# Patient Record
Sex: Female | Born: 1950 | Race: White | Hispanic: No | Marital: Single | Smoking: Former smoker
Health system: Southern US, Community
[De-identification: ages and names within clinical notes are randomized; demographics above are authoritative.]

## PROBLEM LIST (undated history)

## (undated) DIAGNOSIS — J189 Pneumonia, unspecified organism: Secondary | ICD-10-CM

## (undated) DIAGNOSIS — Z6841 Body Mass Index (BMI) 40.0 and over, adult: Secondary | ICD-10-CM

## (undated) DIAGNOSIS — I4891 Unspecified atrial fibrillation: Secondary | ICD-10-CM

## (undated) DIAGNOSIS — I5021 Acute systolic (congestive) heart failure: Secondary | ICD-10-CM

## (undated) DIAGNOSIS — R601 Generalized edema: Secondary | ICD-10-CM

## (undated) DIAGNOSIS — I10 Essential (primary) hypertension: Secondary | ICD-10-CM

## (undated) DIAGNOSIS — R600 Localized edema: Secondary | ICD-10-CM

## (undated) DIAGNOSIS — R0600 Dyspnea, unspecified: Secondary | ICD-10-CM

## (undated) HISTORY — DX: Morbid (severe) obesity due to excess calories: E66.01

## (undated) HISTORY — DX: Dyspnea, unspecified: R06.00

## (undated) HISTORY — DX: Body Mass Index (BMI) 40.0 and over, adult: Z684

## (undated) HISTORY — DX: Acute systolic (congestive) heart failure: I50.21

## (undated) HISTORY — DX: Localized edema: R60.0

## (undated) HISTORY — DX: Unspecified atrial fibrillation: I48.91

## (undated) HISTORY — DX: Generalized edema: R60.1

## (undated) HISTORY — DX: Essential (primary) hypertension: I10

## (undated) HISTORY — DX: Pneumonia, unspecified organism: J18.9

---

## 2016-01-29 ENCOUNTER — Inpatient Hospital Stay (HOSPITAL_COMMUNITY)
Admission: EM | Admit: 2016-01-29 | Discharge: 2016-02-04 | DRG: 308 | Disposition: A | Discharge status: Home / Self Care | Payer: BLUE CROSS/BLUE SHIELD | Attending: Internal Medicine | Admitting: Internal Medicine

## 2016-01-29 ENCOUNTER — Encounter (HOSPITAL_COMMUNITY): Payer: Self-pay

## 2016-01-29 ENCOUNTER — Emergency Department (HOSPITAL_COMMUNITY): Payer: BLUE CROSS/BLUE SHIELD

## 2016-01-29 DIAGNOSIS — I4891 Unspecified atrial fibrillation: Secondary | ICD-10-CM | POA: Diagnosis not present

## 2016-01-29 DIAGNOSIS — R600 Localized edema: Secondary | ICD-10-CM | POA: Diagnosis present

## 2016-01-29 DIAGNOSIS — Z6841 Body Mass Index (BMI) 40.0 and over, adult: Secondary | ICD-10-CM

## 2016-01-29 DIAGNOSIS — F419 Anxiety disorder, unspecified: Secondary | ICD-10-CM | POA: Diagnosis not present

## 2016-01-29 DIAGNOSIS — R0602 Shortness of breath: Secondary | ICD-10-CM

## 2016-01-29 DIAGNOSIS — J189 Pneumonia, unspecified organism: Secondary | ICD-10-CM | POA: Diagnosis present

## 2016-01-29 DIAGNOSIS — Z87891 Personal history of nicotine dependence: Secondary | ICD-10-CM

## 2016-01-29 DIAGNOSIS — I5021 Acute systolic (congestive) heart failure: Secondary | ICD-10-CM | POA: Diagnosis present

## 2016-01-29 DIAGNOSIS — I5031 Acute diastolic (congestive) heart failure: Secondary | ICD-10-CM | POA: Diagnosis not present

## 2016-01-29 DIAGNOSIS — I5020 Unspecified systolic (congestive) heart failure: Secondary | ICD-10-CM | POA: Diagnosis present

## 2016-01-29 DIAGNOSIS — R0600 Dyspnea, unspecified: Secondary | ICD-10-CM

## 2016-01-29 DIAGNOSIS — I480 Paroxysmal atrial fibrillation: Secondary | ICD-10-CM

## 2016-01-29 DIAGNOSIS — E669 Obesity, unspecified: Secondary | ICD-10-CM | POA: Diagnosis present

## 2016-01-29 DIAGNOSIS — I429 Cardiomyopathy, unspecified: Secondary | ICD-10-CM | POA: Diagnosis present

## 2016-01-29 DIAGNOSIS — R601 Generalized edema: Secondary | ICD-10-CM | POA: Diagnosis present

## 2016-01-29 DIAGNOSIS — I248 Other forms of acute ischemic heart disease: Secondary | ICD-10-CM | POA: Diagnosis not present

## 2016-01-29 HISTORY — DX: Pneumonia, unspecified organism: J18.9

## 2016-01-29 HISTORY — DX: Unspecified atrial fibrillation: I48.91

## 2016-01-29 LAB — BRAIN NATRIURETIC PEPTIDE: B Natriuretic Peptide: 513.8 pg/mL — ABNORMAL HIGH (ref 0.0–100.0)

## 2016-01-29 LAB — PROTIME-INR
INR: 1.42
PROTHROMBIN TIME: 17.5 s — AB (ref 11.4–15.2)

## 2016-01-29 LAB — CBC
HCT: 48.1 % — ABNORMAL HIGH (ref 36.0–46.0)
Hemoglobin: 16.3 g/dL — ABNORMAL HIGH (ref 12.0–15.0)
MCH: 30.2 pg (ref 26.0–34.0)
MCHC: 33.9 g/dL (ref 30.0–36.0)
MCV: 89.1 fL (ref 78.0–100.0)
PLATELETS: 243 10*3/uL (ref 150–400)
RBC: 5.4 MIL/uL — ABNORMAL HIGH (ref 3.87–5.11)
RDW: 14.8 % (ref 11.5–15.5)
WBC: 11.8 10*3/uL — AB (ref 4.0–10.5)

## 2016-01-29 LAB — BASIC METABOLIC PANEL
Anion gap: 14 (ref 5–15)
BUN: 12 mg/dL (ref 6–20)
CALCIUM: 9.2 mg/dL (ref 8.9–10.3)
CHLORIDE: 102 mmol/L (ref 101–111)
CO2: 21 mmol/L — ABNORMAL LOW (ref 22–32)
CREATININE: 0.89 mg/dL (ref 0.44–1.00)
GFR calc non Af Amer: >60 mL/min (ref >60)
Glucose, Bld: 113 mg/dL — ABNORMAL HIGH (ref 65–99)
Potassium: 3.9 mmol/L (ref 3.5–5.1)
SODIUM: 137 mmol/L (ref 135–145)

## 2016-01-29 LAB — I-STAT TROPONIN, ED
TROPONIN I, POC: 0.03 ng/mL (ref 0.00–0.08)
Troponin i, poc: 0.05 ng/mL (ref 0.00–0.08)

## 2016-01-29 LAB — HEPARIN LEVEL (UNFRACTIONATED): HEPARIN UNFRACTIONATED: 0.35 [IU]/mL (ref 0.30–0.70)

## 2016-01-29 LAB — HEPATIC FUNCTION PANEL
ALT: 27 U/L (ref 14–54)
AST: 35 U/L (ref 15–41)
Albumin: 3.6 g/dL (ref 3.5–5.0)
Alkaline Phosphatase: 62 U/L (ref 38–126)
BILIRUBIN DIRECT: 0.6 mg/dL — AB (ref 0.1–0.5)
BILIRUBIN INDIRECT: 1.2 mg/dL — AB (ref 0.3–0.9)
BILIRUBIN TOTAL: 1.8 mg/dL — AB (ref 0.3–1.2)
Total Protein: 7.3 g/dL (ref 6.5–8.1)

## 2016-01-29 LAB — TSH
TSH: 3.477 u[IU]/mL (ref 0.350–4.500)
TSH: 2.363 u[IU]/mL (ref 0.350–4.500)

## 2016-01-29 LAB — TROPONIN I: TROPONIN I: 0.03 ng/mL — AB (ref <0.03)

## 2016-01-29 MED ORDER — DILTIAZEM HCL 100 MG IV SOLR
5.0000 mg/h | INTRAVENOUS | Status: DC
  Administered 2016-01-29 – 2016-01-30 (×2): 15 mg/h via INTRAVENOUS
  Filled 2016-01-29: qty 100

## 2016-01-29 MED ORDER — AMIODARONE LOAD VIA INFUSION
150.0000 mg | Freq: Once | INTRAVENOUS | Status: AC
  Administered 2016-01-30: 150 mg via INTRAVENOUS
  Filled 2016-01-29: qty 83.34

## 2016-01-29 MED ORDER — AMIODARONE HCL IN DEXTROSE 360-4.14 MG/200ML-% IV SOLN
60.0000 mg/h | INTRAVENOUS | Status: AC
  Administered 2016-01-30 (×2): 60 mg/h via INTRAVENOUS
  Filled 2016-01-29 (×2): qty 200

## 2016-01-29 MED ORDER — HEPARIN (PORCINE) IN NACL 100-0.45 UNIT/ML-% IJ SOLN
1350.0000 [IU]/h | INTRAMUSCULAR | Status: DC
  Administered 2016-01-29 (×2): 1350 [IU]/h via INTRAVENOUS
  Filled 2016-01-29 (×2): qty 250

## 2016-01-29 MED ORDER — DILTIAZEM LOAD VIA INFUSION
10.0000 mg | Freq: Once | INTRAVENOUS | Status: AC
  Administered 2016-01-29: 10 mg via INTRAVENOUS
  Filled 2016-01-29: qty 10

## 2016-01-29 MED ORDER — ASPIRIN EC 81 MG PO TBEC
81.0000 mg | DELAYED_RELEASE_TABLET | Freq: Every day | ORAL | Status: DC
  Administered 2016-01-29 – 2016-01-30 (×2): 81 mg via ORAL
  Filled 2016-01-29 (×2): qty 1

## 2016-01-29 MED ORDER — AMIODARONE HCL IN DEXTROSE 360-4.14 MG/200ML-% IV SOLN
30.0000 mg/h | INTRAVENOUS | Status: DC
Start: 2016-01-30 — End: 2016-02-02
  Administered 2016-01-30 – 2016-02-02 (×5): 30 mg/h via INTRAVENOUS
  Filled 2016-01-29 (×6): qty 200

## 2016-01-29 MED ORDER — ACETAMINOPHEN 325 MG PO TABS: 650.0000 mg | ORAL_TABLET | ORAL | Status: DC | PRN

## 2016-01-29 MED ORDER — FUROSEMIDE 10 MG/ML IJ SOLN
40.0000 mg | Freq: Two times a day (BID) | INTRAMUSCULAR | Status: DC
  Administered 2016-01-30 (×3): 40 mg via INTRAVENOUS
  Filled 2016-01-29 (×4): qty 4

## 2016-01-29 MED ORDER — AZITHROMYCIN 250 MG PO TABS
500.0000 mg | ORAL_TABLET | ORAL | Status: DC
  Administered 2016-01-30 – 2016-02-03 (×5): 500 mg via ORAL
  Filled 2016-01-29 (×5): qty 2

## 2016-01-29 MED ORDER — DEXTROSE 5 % IV SOLN
500.0000 mg | Freq: Once | INTRAVENOUS | Status: AC
  Administered 2016-01-29: 500 mg via INTRAVENOUS
  Filled 2016-01-29: qty 500

## 2016-01-29 MED ORDER — DEXTROSE 5 % IV SOLN
1.0000 g | Freq: Once | INTRAVENOUS | Status: AC
  Administered 2016-01-29: 1 g via INTRAVENOUS
  Filled 2016-01-29: qty 10

## 2016-01-29 MED ORDER — DEXTROSE 5 % IV SOLN
1.0000 g | INTRAVENOUS | Status: DC
  Administered 2016-01-30 – 2016-02-03 (×5): 1 g via INTRAVENOUS
  Filled 2016-01-29 (×6): qty 10

## 2016-01-29 MED ORDER — DILTIAZEM HCL 100 MG IV SOLR
5.0000 mg/h | INTRAVENOUS | Status: DC
  Administered 2016-01-29: 5 mg/h via INTRAVENOUS
  Administered 2016-01-29: 7.5 mg/h via INTRAVENOUS
  Administered 2016-01-29: 10 mg/h via INTRAVENOUS
  Filled 2016-01-29 (×2): qty 100

## 2016-01-29 MED ORDER — ONDANSETRON HCL 4 MG/2ML IJ SOLN: 4.0000 mg | Freq: Four times a day (QID) | INTRAMUSCULAR | Status: DC | PRN

## 2016-01-29 MED ORDER — HEPARIN BOLUS VIA INFUSION
4000.0000 [IU] | Freq: Once | INTRAVENOUS | Status: AC
  Administered 2016-01-29: 4000 [IU] via INTRAVENOUS
  Filled 2016-01-29: qty 4000

## 2016-01-29 NOTE — ED Notes (Signed)
Hospital bed ordered.

## 2016-01-29 NOTE — ED Notes (Signed)
EDP at bedside  

## 2016-01-29 NOTE — ED Notes (Signed)
PT did not have to urinate at this time 

## 2016-01-29 NOTE — Progress Notes (Signed)
ANTICOAGULATION CONSULT NOTE - Initial Consult  Pharmacy Consult for heparin  Indication: atrial fibrillation  Allergies  Allergen Reactions  . Gluten Meal Shortness Of Breath and Other (See Comments)    Triggered fluid around her heart a couple of years ago (NO BREAD OR PASTA)  . Wheat Bran Shortness Of Breath    Also, possible edema (around heart)  . Mold Extract [Trichophyton] Other (See Comments)    Causes congestion (sudden)  . Neosporin [Neomycin-Bacitracin Zn-Polymyx] Rash  . Other Rash    Neoprene (NO!)  . Petroleum Jelly Lip Treatment [Dimethicone] Rash  . Tape Rash    Patient Measurements: Height: 5\' 7"  (170.2 cm) Weight: (!) 312 lb (141.5 kg) IBW/kg (Calculated) : 61.6 Heparin Dosing Weight: 96.4 kg  Vital Signs: Temp: 97.7 F (36.5 C) (01/08 1158) Temp Source: Oral (01/08 1158) BP: 130/94 (01/08 1630) Pulse Rate: 149 (01/08 1630)  Labs:  Recent Labs  01/29/16 1316  HGB 16.3*  HCT 48.1*  PLT 243  LABPROT 17.5*  INR 1.42  CREATININE 0.89    Estimated Creatinine Clearance: 95.6 mL/min (by C-G formula based on SCr of 0.89 mg/dL).   Assessment: 66 yo female presents with fatigue and SOB. Found to have new onset AFib with RVR. Pt is not on any anticoagulants prior to admission, baseline INR mildly elevated 1.4, hgb/plts wnl. Will start heparin gtt for AFib.    Goal of Therapy:  Heparin level 0.3-0.7 units/ml Monitor platelets by anticoagulation protocol: Yes    Plan:  -Heparin bolus 4000 units x1 then infuse at 1350 units/hr -Daily HL, CBC -Level tonight -F/u transition to oral anticoagulation    Briana Kane, Briana Kane 01/29/2016,5:00 PM

## 2016-01-29 NOTE — ED Notes (Signed)
This RN walked in the Room and the pt was sitting at the edge of the bed and had taken the BP cuff off. Pt said "it was hurting and she couldn't handle it anymore".

## 2016-01-29 NOTE — ED Notes (Signed)
Pharmacy called x 2 to verify cardiazem

## 2016-01-29 NOTE — Consult Note (Signed)
Cardiology Consult    Patient ID: Briana Kane MRN: [id number], DOB/AGE: 1950/04/09   Admit date: 01/29/2016 Date of Consult: 01/29/2016  Primary Physician: Pcp Not In System Reason for Consult: Afib RVR Primary Cardiologist: None Requesting Provider: Dr. Randol Kern  Patient Profile    Briana Kane is a 66 year old female with no past medical history as she does not see a PCP or any other doctors. Admitted on 01/29/16 with CAP and Afib RVR.   History of Present Illness    Briana Kane tells me that she hasn't felt well for about 2 months. She has been more fatigued and noticed that she gets SOB with minimal activity. She developed a cough a week ago that has gradually worsened and prompted her to Kane to an Urgent Care today. She was told at the Urgent Care Center that she had a rapid heart rate and an arrhythmia so she was referred to the ED.   Upon arrival to the ED, her EKG showed atrial fibrillation with RVR. She was started on Cardizem gtt in the ED. Her chest X ray showed PNA and she is being treated per the primary team. Her BNP is elevated at 513, creatinine is within normal range.   Past Medical History   History reviewed. No pertinent past medical history.  History reviewed. No pertinent surgical history.   Allergies  Allergies  Allergen Reactions  . Gluten Meal Shortness Of Breath and Other (See Comments)    Triggered fluid around her heart a couple of years ago (NO BREAD OR PASTA)  . Wheat Bran Shortness Of Breath    Also, possible edema (around heart)  . Mold Extract [Trichophyton] Other (See Comments)    Causes congestion (sudden)  . Neosporin [Neomycin-Bacitracin Zn-Polymyx] Rash  . Other Rash    Neoprene (NO!)  . Petroleum Jelly Lip Treatment [Dimethicone] Rash  . Tape Rash    Inpatient Medications    . furosemide  40 mg Intravenous BID    Family History    Family History  Problem Relation Age of Onset  . Hypertension Mother     Social  History    Social History   Social History  . Marital status: Unknown    Spouse name: N/A  . Number of children: N/A  . Years of education: N/A   Occupational History  . Not on file.   Social History Main Topics  . Smoking status: Former Smoker    Quit date: 01/29/1991  . Smokeless tobacco: Never Used  . Alcohol use No  . Drug use: Unknown  . Sexual activity: Not on file   Other Topics Concern  . Not on file   Social History Narrative  . No narrative on file     Review of Systems    General:  No chills, fever, night sweats or weight changes.  Cardiovascular:  No chest pain, + dyspnea on exertion, + edema, orthopnea, palpitations, paroxysmal nocturnal dyspnea. Dermatological: No rash, lesions/masses Respiratory: + cough, dyspnea Urologic: No hematuria, dysuria Abdominal:   No nausea, vomiting, diarrhea, bright red blood per rectum, melena, or hematemesis Neurologic:  No visual changes, wkns, changes in mental status. All other systems reviewed and are otherwise negative except as noted above.  Physical Exam    Blood pressure 128/75, pulse (!) 126, temperature 97.7 F (36.5 C), temperature source Oral, resp. rate 25, height 5\' 7"  (1.702 m), weight (!) 312 lb (141.5 kg), SpO2 93 %.  General: Pleasant, obese female  Psych: Normal affect. Neuro: Alert and oriented X 3. Moves all extremities spontaneously. HEENT: Normal  Neck: Supple without bruits or JVD. Lungs:  Resp regular and unlabored, bilateral rales in bases.  Heart: irregularly irregular.  No s3, s4, or murmurs. Abdomen: Soft, non-tender, non-distended, BS + x 4.  Extremities: No clubbing, cyanosis or edema. DP/PT/Radials 2+ and equal bilaterally.  Labs    Troponin [facility] of Care Test)  Recent Labs  01/29/16 1358  TROPIPOC 0.03    Lab Results  Component Value Date   WBC 11.8 (H) 01/29/2016   HGB 16.3 (H) 01/29/2016   HCT 48.1 (H) 01/29/2016   MCV 89.1 01/29/2016   PLT 243 01/29/2016     Recent  Labs Lab 01/29/16 1316  NA 137  K 3.9  CL 102  CO2 21*  BUN 12  CREATININE 0.89  CALCIUM 9.2  PROT 7.3  BILITOT 1.8*  ALKPHOS 62  ALT 27  AST 35  GLUCOSE 113*    Radiology Studies    Dg Chest Portable 1 View  Result Date: 01/29/2016 CLINICAL DATA:  66 year old female with shortness of breath and cough for 2 weeks. Former smoker. Initial encounter. EXAM: PORTABLE CHEST 1 VIEW COMPARISON:  None. FINDINGS: Portable AP upright view at 1249 hours. Suggestion of cardiomegaly but other mediastinal contours appear normal. Veiling opacity at both lung bases. No superimposed pneumothorax or definite pulmonary edema. No other confluent pulmonary opacity. Negative visible bowel gas pattern in the upper abdomen. IMPRESSION: 1. Veiling bibasilar pulmonary opacity is nonspecific but might relate to bilateral pleural effusions. PA and lateral views of the chest recommended when possible. 2. Suggestion of cardiomegaly.  No acute pulmonary edema. Electronically Signed   By: Odessa Fleming M.D.   On: 01/29/2016 13:06    EKG & Cardiac Imaging    EKG: Afib RVR  Echocardiogram: pending   Assessment & Plan    1. Atrial fibrillation with RVR: Presents with fatigue, cough and found to be in Afib. Continue diltiazem gtt, will add Metoprolol  BID. She needs more rate control.   This patients CHA2DS2-VASc Score and unadjusted Ischemic Stroke Rate (% per year) is equal to 2.2 % stroke rate/year from a score of 2 Above score calculated as 1 point each if present [CHF, HTN, DM, Vascular=MI/PAD/Aortic Plaque, Age if 65-74, or Female], 2 points each if present [Age > 75, or Stroke/TIA/TE]  She has a CHA2DS2-VASc score of 2 for gender only, and we talked about anticoagulation but she prefers to use natural herbs, specifically tumeric for anticoagulation and says there is plenty of research that supports this.   Her BNP is elevated and she has bilateral lower extremity edema, will get Echo. I fear that she has some  degree of LV dysfunction from her Afib.   Will need to readdress her meds once we get her Echo. She would benefit from DCCV but cannot preform if she doesn't agree to anticoagulation.   Also will check TSH.   2. CAP: Per primary team.  3. Bilateral lower extremity edema: Will diurese with IV lasix.     Signed, Little Ishikawa, NP 01/29/2016, 4:53 PM Pager: [phone]  I have personally seen and examined this patient with Suzzette Righter, NP. I agree with the assessment and plan as outlined above. She is admitted with pneumonia and atrial fib with RVR. My exam shows an obese female in NAD. UJ:WJXBJ irreg. Pulm: rhonci bases. Ext: 2+ pitting edema bilaterally. YNW:GNFA, NT. Labs reviewed by me. EKG reviewed  by me. This shows atrial fib with RVR Agree with admission to tele bed. She has agreed to IV Heparin and will consider allowing [place] to start Eliquis tomorrow. Continue IV Diltiazem for rate control. Add beta blocker. Will start IV Lasix for diuresis. Echo tomorrow to assess LVEF.   Verne Carrow 01/29/2016'6:35 PM

## 2016-01-29 NOTE — ED Notes (Signed)
Portable xray at bedside.

## 2016-01-29 NOTE — ED Triage Notes (Signed)
Pt reports she went to [facility] today for SOB and cough that started about 2 weeks ago. Pt sent here from [facility] due to "arrythmia." HR 170 at triage. Denies hx of afib. Pt denies having any chest pain or discomfort. Only complaint is trouble walking and getting around due to the SOB and swelling in legs.

## 2016-01-29 NOTE — ED Notes (Signed)
This RN went to attempt IV access and pt denies any attempt at this time. Pt informed of reasoning for 2nd IV and pt still refusing at this time.

## 2016-01-29 NOTE — ED Provider Notes (Signed)
[facility]-EMERGENCY DEPT Provider Note   CSN: [id number] Arrival date & time: 01/29/16  1145     History   Chief Complaint Chief Complaint  Briana Kane presents with  . Shortness of Breath    HPI Briana Kane is a 66 y.o. female with no significant past medical history who presents from an urgent care for fatigue, shortness of breath, generalized edema, productive cough, and tachycardia in the 170s. Briana Kane reports that Briana Kane's been having some subjective chills for the last few days and has had a productive cough. Briana Kane says that Briana Kane has been having some generalized fatigue and is having shortness of breath whenever Briana Kane tries to ambulate. Briana Kane says Briana Kane's had bilateral lower extremity swelling and generalized edema in Briana Kane face. Briana Kane denies any history of this. Briana Kane says that Briana Kane has had decrease in Briana Kane urine output for the last week. Briana Kane reports chronic constipation. Briana Kane denies any chest pain or palpitations. Briana Kane went to urgent care today where Briana Kane was found to be in A. fib with RVR with a heart rate in the 170s. Briana Kane was then sent to the emergency department for further evaluation and workup.   Of note, Briana Kane does not take any medications but does take multiple oral natural supplements.    The history is provided by the Briana Kane and a friend. No language interpreter was used.  Cough  This is a new problem. The current episode started more than 2 days ago. The problem occurs constantly. The problem has been gradually worsening. The cough is productive of sputum. Associated symptoms include chills and shortness of breath. Pertinent negatives include no chest pain, no headaches, no rhinorrhea and no wheezing. Briana Kane has tried nothing for the symptoms.  Shortness of Breath  This is a new problem. The average episode lasts 1 week. The problem occurs continuously.The problem has been gradually worsening. Associated symptoms include cough, sputum production and leg swelling. Pertinent negatives include no  fever, no headaches, no rhinorrhea, no neck pain, no hemoptysis, no wheezing, no chest pain, no syncope, no vomiting, no abdominal pain, no rash and no leg pain. Briana Kane has tried nothing for the symptoms.    History reviewed. No pertinent past medical history.  Briana Kane Active Problem List   Diagnosis Date Noted  . Atrial fibrillation (HCC) 01/29/2016  . Community acquired pneumonia 01/29/2016  . Obesity 01/29/2016  . Bilateral leg edema 01/29/2016    History reviewed. No pertinent surgical history.  OB History    No data available       Home Medications    Prior to Admission medications   Not on File    Family History No family history on file.  Social History Social History  Substance Use Topics  . Smoking status: Former Smoker    Quit date: 01/29/1991  . Smokeless tobacco: Never Used  . Alcohol use No     Allergies   Gluten meal; Mold extract [trichophyton]; and Neosporin [neomycin-bacitracin zn-polymyx]   Review of Systems Review of Systems  Constitutional: Positive for chills and fatigue. Negative for diaphoresis and fever.  HENT: Positive for congestion. Negative for rhinorrhea.   Respiratory: Positive for cough, sputum production and shortness of breath. Negative for hemoptysis, chest tightness, wheezing and stridor.   Cardiovascular: Positive for leg swelling. Negative for chest pain, palpitations and syncope.  Gastrointestinal: Negative for abdominal pain, constipation, diarrhea, nausea and vomiting.  Genitourinary: Positive for decreased urine volume. Negative for dysuria and flank pain.  Musculoskeletal: Negative for back pain and neck pain.  Skin: Negative for rash and wound.  Neurological: Negative for light-headedness, numbness and headaches.  Psychiatric/Behavioral: Negative for agitation and confusion.  All other systems reviewed and are negative.    Physical Exam Updated Vital Signs BP 126/97 (BP Location: Right Arm)   Pulse (!) 168   Temp  97.7 F (36.5 C) (Oral)   Resp 16   Ht 5\' 7"  (1.702 m)   Wt (!) 312 lb (141.5 kg)   SpO2 96%   BMI 48.87 kg/m   Physical Exam  Constitutional: Briana Kane is oriented to person, place, and time. Briana Kane appears well-developed and well-nourished. No distress.  HENT:  Head: Normocephalic and atraumatic.  Mouth/Throat: Oropharynx is clear and moist. No oropharyngeal exudate.  Eyes: Conjunctivae and EOM are normal. Pupils are equal, round, and reactive to light.  Neck: Normal range of motion. Neck supple.  Cardiovascular: Intact distal pulses.  An irregularly irregular rhythm present. Tachycardia present.   No murmur heard. Pulmonary/Chest: Effort normal. Tachypnea noted. No respiratory distress. Briana Kane has no wheezes. Briana Kane has rhonchi in the right lower field and the left lower field. Briana Kane has no rales. Briana Kane exhibits no tenderness.  Abdominal: Soft. There is no tenderness.  Musculoskeletal: Briana Kane exhibits edema (bilateral legs).  Neurological: Briana Kane is alert and oriented to person, place, and time. No cranial nerve deficit or sensory deficit.  Skin: Skin is warm and dry. Capillary refill takes less than 2 seconds. No rash noted. No erythema.  Psychiatric: Briana Kane has a normal mood and affect.  Nursing note and vitals reviewed.    ED Treatments / Results  Labs (all labs ordered are listed, but only abnormal results are displayed) Labs Reviewed  BASIC METABOLIC PANEL - Abnormal; Notable for the following:       Result Value   CO2 21 (*)    Glucose, Bld 113 (*)    All other components within normal limits  CBC - Abnormal; Notable for the following:    WBC 11.8 (*)    RBC 5.40 (*)    Hemoglobin 16.3 (*)    HCT 48.1 (*)    All other components within normal limits  PROTIME-INR - Abnormal; Notable for the following:    Prothrombin Time 17.5 (*)    All other components within normal limits  HEPATIC FUNCTION PANEL - Abnormal; Notable for the following:    Total Bilirubin 1.8 (*)    Bilirubin, Direct 0.6  (*)    Indirect Bilirubin 1.2 (*)    All other components within normal limits  BRAIN NATRIURETIC PEPTIDE - Abnormal; Notable for the following:    B Natriuretic Peptide 513.8 (*)    All other components within normal limits  RESPIRATORY PANEL BY PCR  URINALYSIS, ROUTINE W REFLEX MICROSCOPIC  TSH  INFLUENZA PANEL BY PCR (TYPE A & B, H1N1)  LEGIONELLA PNEUMOPHILA SEROGP 1 UR AG  HEMOGLOBIN A1C  LIPID PANEL  HEPARIN LEVEL (UNFRACTIONATED)  I-STAT TROPOININ, ED  I-STAT TROPOININ, ED  Rosezena Sensor, ED    EKG  EKG Interpretation  Date/Time:  Monday January 29 2016 11:55:05 EST Ventricular Rate:  165 PR Interval:    QRS Duration: 84 QT Interval:  266 QTC Calculation: 440 R Axis:   -128 Text Interpretation:   Suspect arm lead reversal, interpretation assumes no reversal Atrial fibrillation with rapid ventricular response with premature ventricular or aberrantly conducted complexes Low voltage QRS Anterolateral infarct , age undetermined Abnormal ECG Afib with RVR No STEMI Confirmed by Rush Landmark MD, CHRISTOPHER [phone]) on 01/29/2016 12:58:51 PM  Radiology Dg Chest Portable 1 View  Result Date: 01/29/2016 CLINICAL DATA:  66 year old female with shortness of breath and cough for 2 weeks. Former smoker. Initial encounter. EXAM: PORTABLE CHEST 1 VIEW COMPARISON:  None. FINDINGS: Portable AP upright view at 1249 hours. Suggestion of cardiomegaly but other mediastinal contours appear normal. Veiling opacity at both lung bases. No superimposed pneumothorax or definite pulmonary edema. No other confluent pulmonary opacity. Negative visible bowel gas pattern in the upper abdomen. IMPRESSION: 1. Veiling bibasilar pulmonary opacity is nonspecific but might relate to bilateral pleural effusions. PA and lateral views of the chest recommended when possible. 2. Suggestion of cardiomegaly.  No acute pulmonary edema. Electronically Signed   By: Odessa Fleming M.D.   On: 01/29/2016 13:06     Procedures Procedures (including critical care time)  CRITICAL CARE Performed by: Canary Brim Tegeler Total critical care time: 35 minutes Critical care time was exclusive of separately billable procedures and treating other patients. Critical care was necessary to treat or prevent imminent or life-threatening deterioration. Critical care was time spent personally by me on the following activities: development of treatment plan with Briana Kane and/or surrogate as well as nursing, discussions with consultants, evaluation of Briana Kane's response to treatment, examination of Briana Kane, obtaining history from Briana Kane or surrogate, ordering and performing treatments and interventions, ordering and review of laboratory studies, ordering and review of radiographic studies, pulse oximetry and re-evaluation of Briana Kane's condition.   Medications Ordered in ED Medications  diltiazem (CARDIZEM) 1 mg/mL load via infusion 10 mg (10 mg Intravenous Bolus from Bag 01/29/16 1329)    And  diltiazem (CARDIZEM) 100 mg in dextrose 5 % 100 mL (1 mg/mL) infusion (15 mg/hr Intravenous Rate/Dose Change 01/29/16 1657)  azithromycin (ZITHROMAX) 500 mg in dextrose 5 % 250 mL IVPB (500 mg Intravenous New Bag/Given 01/29/16 1703)  furosemide (LASIX) injection 40 mg (not administered)  heparin bolus via infusion 4,000 Units (not administered)  heparin ADULT infusion 100 units/mL (25000 units/232mL sodium chloride 0.45%) (not administered)  cefTRIAXone (ROCEPHIN) 1 g in dextrose 5 % 50 mL IVPB (0 g Intravenous Stopped 01/29/16 1703)     Initial Impression / Assessment and Plan / ED Course  I have reviewed the triage vital signs and the nursing notes.  Pertinent labs & imaging results that were available during my care of the Briana Kane were reviewed by me and considered in my medical decision making (see chart for details).  Clinical Course     YVONE SLAPE is a 66 y.o. female with no significant past medical history who  presents from an urgent care for fatigue, shortness of breath, generalized edema, productive cough, and tachycardia in the 170s.  History and exam are seen above. On exam, Briana Kane is tachycardic with a heart rate in the 170s. Briana Kane has an irregularly irregular heart rate. EKG was performed and showed atrial fibrillation with RVR. No evidence of STEMI.  On lung exam, Briana Kane had some coarse breath sounds in the bases. Briana Kane had no chest tenderness or abdominal tenderness. Bilateral lower extremity edema which Briana Kane reports is new. No leg pain or tenderness. No focal neurologic deficits or other findings on exam.  Given Briana Kane's productive cough, chills, and shortness of breath, Briana Kane workup to look for pneumonia prompting Briana Kane A. fib with RVR.    chest x-ray was obtained and shows evidence of pneumonia in the bilateral bases. Other lab testing showed slightly elevated BNP of 5.3. Troponin negative. Briana Kane had a mild leukocytosis likely consistent with Briana Kane pneumonia.  No evidence of kidney dysfunction causing Briana Kane edema.  Briana Kane given antibiotics for pneumonia. Briana Kane given diltiazem bolus and drip for Briana Kane A. fib with RVR. Heart rate came down from 170s into the 130s/140s. Cardiology was called and they will be consulted on the Briana Kane.   Briana Kane will be admitted to the hospitalist service in the stepdown unit for further management of Briana Kane new A. fib with RVR and pneumonia. As Briana Kane isn't feeling that for approximately one week and does not express chest pain or palpitations, it is unknown how long Briana Kane has been in this rhythm without anticoagulation. Do not feel safe performing cardioversion on Briana Kane at this time given Briana Kane persistent RVR and symptoms.  Briana Kane will be admitted for further management.    Final Clinical Impressions(s) / ED Diagnoses   Final diagnoses:  Community acquired pneumonia, unspecified laterality  Atrial fibrillation with RVR (HCC)  Generalized edema  SOB  (shortness of breath)     Clinical Impression: 1. Community acquired pneumonia, unspecified laterality   2. Atrial fibrillation with RVR (HCC)   3. Generalized edema   4. SOB (shortness of breath)     Disposition: Admit to hospitalist stepdown unit    Heide Scales, MD 01/29/16 1757

## 2016-01-29 NOTE — H&P (Signed)
TRH H&P   Patient Demographics:    Briana Kane, is a 66 y.o. female  MRN: [id number]   DOB - October 16, 1950  Admit Date - 01/29/2016  Outpatient Primary MD for the patient is Pcp Not In System  Referring MD/NP/PA: Dr Julieanne Manson  Patient coming from: Home  Chief Complaint  Patient presents with  . Shortness of Breath      HPI:    Briana Kane  is a 66 y.o. female, Without significant past medical history, does not follow with any PCP, [facility] ED today with complaints of shortness of breath, cough over last week, 10 productive over the last 2 days, or shortness of breath mainly with exertion, but progressive, currently even at rest, reports some orthopnea as well, not any fever or chills, chest pain, recent travel, reports intermittent lower addition to edema over last 2 years, reportedly gets worse with eating chips or salty food. In ED she was found to have new onset A. fib with RVR, requiring Cardizem drip, chest x-ray showing bibasilar pneumonia, she is afebrile, her BNP is elevated at 513, started on Rocephin and azithromycin, and hospitalist requested to admit.    Review of systems:    In addition to the HPI above,  No Fever-chills, No Headache, No changes with Vision or hearing, No problems swallowing food or Liquids, No Chest pain, reports Cough and  Shortness of Breath, No Abdominal pain, No Nausea or Vommitting, Bowel movements are regular, No Blood in stool or Urine, No dysuria, No new skin rashes or bruises, No new joints pains-aches,  No new weakness, tingling, numbness in any extremity, No recent weight gain or loss, No polyuria, polydypsia or polyphagia, No significant Mental Stressors.  A full 10 point Review of Systems was done, except as stated above, all other Review of Systems were negative.   With Past History of the following :    History  reviewed. No pertinent past medical history.    History reviewed. No pertinent surgical history.    Social History:     Social History  Substance Use Topics  . Smoking status: Former Smoker    Quit date: 01/29/1991  . Smokeless tobacco: Never Used  . Alcohol use No     Lives - at home   Mobility - Independent    Family History :   Family history significant for mother having A. Fib.   Home Medications:   Prior to Admission medications   Medication Sig Start Date End Date Taking? Authorizing Provider  B Complex Vitamins (B COMPLEX PO) Take 1 tablet by mouth 2 (two) times daily.   Yes Historical Provider, MD  COENZYME Q-10 PO Take 1 capsule by mouth 4 (four) times daily.   Yes Historical Provider, MD  HAWTHORNE BERRY PO Take 1 capsule by mouth 2 (two) times daily.   Yes Historical Provider, MD  MAGNESIUM PO Take 1,000 mg  by mouth daily.   Yes Historical Provider, MD  Misc Natural Products (TURMERIC CURCUMIN) CAPS Take 2 capsules by mouth 2 (two) times daily.   Yes Historical Provider, MD  NIACIN PO Take 2 tablets by mouth 2 (two) times daily.   Yes Historical Provider, MD  OVER THE COUNTER MEDICATION Beet Root: Take 1 capsule by mouth four times a day   Yes Historical Provider, MD  OVER THE COUNTER MEDICATION Ashwaganda capsules: Take 2 capsules by mouth two times a day   Yes Historical Provider, MD  OVER THE COUNTER MEDICATION Moringa capsules: Take 2 capsules by mouth two times a day   Yes Historical Provider, MD  POTASSIUM CHLORIDE PO Take 1,000 mg by mouth daily.   Yes Historical Provider, MD     Allergies:     Allergies  Allergen Reactions  . Gluten Meal Shortness Of Breath and Other (See Comments)    Triggered fluid around her heart a couple of years ago (NO BREAD OR PASTA)  . Wheat Bran Shortness Of Breath    Also, possible edema (around heart)  . Mold Extract [Trichophyton] Other (See Comments)    Causes congestion (sudden)  . Neosporin [Neomycin-Bacitracin  Zn-Polymyx] Rash  . Other Rash    Neoprene (NO!)  . Petroleum Jelly Lip Treatment [Dimethicone] Rash  . Tape Rash     Physical Exam:   Vitals  Blood pressure 128/75, pulse (!) 126, temperature 97.7 F (36.5 C), temperature source Oral, resp. rate 25, height 5\' 7"  (1.702 m), weight (!) 141.5 kg (312 lb), SpO2 93 %.   1. General obese femal lying in bed in NAD,    2. Normal affect and insight, Not Suicidal or Homicidal, Awake Alert, Oriented X 3.  3. No F.N deficits, ALL C.Nerves Intact, Strength 5/5 all 4 extremities, Sensation intact all 4 extremities, Plantars down going.  4. Ears and Eyes appear Normal, Conjunctivae clear, PERRLA. Moist Oral Mucosa.  5. Supple Neck, No JVD, No cervical lymphadenopathy appriciated, No Carotid Bruits.  6. Symmetrical Chest wall movement, Good air movement bilaterally, CTAB.  7. Irregular irregular, tachycardic, No Gallops, Rubs or Murmurs, No Parasternal Heave, +1 edema.  8. Positive Bowel Sounds, Abdomen Soft, No tenderness, No organomegaly appriciated,No rebound -guarding or rigidity.  9.  No Cyanosis, Normal Skin Turgor, No Skin Rash or Bruise.  10. Good muscle tone,  joints appear normal , no effusions, Normal ROM.  11. No Palpable Lymph Nodes in Neck or Axillae    Data Review:    CBC  Recent Labs Lab 01/29/16 1316  WBC 11.8*  HGB 16.3*  HCT 48.1*  PLT 243  MCV 89.1  MCH 30.2  MCHC 33.9  RDW 14.8   ------------------------------------------------------------------------------------------------------------------  Chemistries   Recent Labs Lab 01/29/16 1316  NA 137  K 3.9  CL 102  CO2 21*  GLUCOSE 113*  BUN 12  CREATININE 0.89  CALCIUM 9.2  AST 35  ALT 27  ALKPHOS 62  BILITOT 1.8*   ------------------------------------------------------------------------------------------------------------------ estimated creatinine clearance is 95.6 mL/min (by C-G formula based on SCr of 0.89  mg/dL). ------------------------------------------------------------------------------------------------------------------ No results for input(s): TSH, T4TOTAL, T3FREE, THYROIDAB in the last 72 hours.  Invalid input(s): FREET3  Coagulation profile  Recent Labs Lab 01/29/16 1316  INR 1.42   ------------------------------------------------------------------------------------------------------------------- No results for input(s): DDIMER in the last 72 hours. -------------------------------------------------------------------------------------------------------------------  Cardiac Enzymes No results for input(s): CKMB, TROPONINI, MYOGLOBIN in the last 168 hours.  Invalid input(s): CK ------------------------------------------------------------------------------------------------------------------    Component Value Date/Time  BNP 513.8 (H) 01/29/2016 1316     ---------------------------------------------------------------------------------------------------------------  Urinalysis No results found for: COLORURINE, APPEARANCEUR, LABSPEC, PHURINE, GLUCOSEU, HGBUR, BILIRUBINUR, KETONESUR, PROTEINUR, UROBILINOGEN, NITRITE, LEUKOCYTESUR  ----------------------------------------------------------------------------------------------------------------   Imaging Results:    Dg Chest Portable 1 View  Result Date: 01/29/2016 CLINICAL DATA:  66 year old female with shortness of breath and cough for 2 weeks. Former smoker. Initial encounter. EXAM: PORTABLE CHEST 1 VIEW COMPARISON:  None. FINDINGS: Portable AP upright view at 1249 hours. Suggestion of cardiomegaly but other mediastinal contours appear normal. Veiling opacity at both lung bases. No superimposed pneumothorax or definite pulmonary edema. No other confluent pulmonary opacity. Negative visible bowel gas pattern in the upper abdomen. IMPRESSION: 1. Veiling bibasilar pulmonary opacity is nonspecific but might relate to bilateral  pleural effusions. PA and lateral views of the chest recommended when possible. 2. Suggestion of cardiomegaly.  No acute pulmonary edema. Electronically Signed   By: Odessa Fleming M.D.   On: 01/29/2016 13:06    My personal review of EKG: A Fib with RVR, Rate  165 /min, QTc 440 , no Acute ST changes   Assessment & Plan:    Active Problems:   Atrial fibrillation (HCC)   Community acquired pneumonia   Obesity   Bilateral leg edema   A fib with RVR - PCP follow-up in the past, no EKG to compare, - Cardiology input greatly appreciated, currently on diltiazem GTT, as well started on metoprolol. - Unclear if she presents with CHF or not, so unclear CHA2DS2-VASc Score , await cardiology recommendations regarding full anticoagulation, meanwhile will continue subcutaneous heparin for DVT prox. - Check TSH, 2-D echo, admit to stepdown and trend troponins  Volume overload/possible CHF - Presents with bilateral lower extremity edema, and elevated BNP, possible CHF, possibly related to A. fib with RVR - Diuresis per cardiology  CAP - Presents with cough, productive sputum, bibasilar infiltrate, admitted on the pneumonia pathway, check sputum cultures, Legionella and strep pneumonia antigen, continue with rocephin and azithromycin. - We'll check influenza and respiratory panel  DVT Prophylaxis Heparin -SCD  AM Labs Ordered, also please review Full Orders  Family Communication: Admission, patients condition and plan of care including tests being ordered have been discussed with the patient and daughter who indicate understanding and agree with the plan and Code Status.  Code Status Full  Likely DC to Home  Condition GUARDED   Consults called: Cardiology  Admission status: Observation  Time spent in minutes : 65 minutes   York Valliant M.D on 01/29/2016 at 4:48 PM  Between 7am to 7pm - Pager - [phone]. After 7pm go to [email or web address] - password [facility]  [facility] - Office   [phone]

## 2016-01-30 ENCOUNTER — Other Ambulatory Visit (HOSPITAL_COMMUNITY): Payer: BLUE CROSS/BLUE SHIELD

## 2016-01-30 DIAGNOSIS — I5031 Acute diastolic (congestive) heart failure: Secondary | ICD-10-CM

## 2016-01-30 DIAGNOSIS — R600 Localized edema: Secondary | ICD-10-CM | POA: Diagnosis not present

## 2016-01-30 DIAGNOSIS — I340 Nonrheumatic mitral (valve) insufficiency: Secondary | ICD-10-CM | POA: Diagnosis not present

## 2016-01-30 DIAGNOSIS — Z6841 Body Mass Index (BMI) 40.0 and over, adult: Secondary | ICD-10-CM | POA: Diagnosis not present

## 2016-01-30 DIAGNOSIS — I5021 Acute systolic (congestive) heart failure: Secondary | ICD-10-CM | POA: Diagnosis not present

## 2016-01-30 DIAGNOSIS — E6609 Other obesity due to excess calories: Secondary | ICD-10-CM | POA: Diagnosis not present

## 2016-01-30 DIAGNOSIS — F419 Anxiety disorder, unspecified: Secondary | ICD-10-CM | POA: Diagnosis not present

## 2016-01-30 DIAGNOSIS — I428 Other cardiomyopathies: Secondary | ICD-10-CM | POA: Diagnosis not present

## 2016-01-30 DIAGNOSIS — I429 Cardiomyopathy, unspecified: Secondary | ICD-10-CM | POA: Diagnosis present

## 2016-01-30 DIAGNOSIS — J189 Pneumonia, unspecified organism: Secondary | ICD-10-CM | POA: Diagnosis present

## 2016-01-30 DIAGNOSIS — Z87891 Personal history of nicotine dependence: Secondary | ICD-10-CM | POA: Diagnosis not present

## 2016-01-30 DIAGNOSIS — I5023 Acute on chronic systolic (congestive) heart failure: Secondary | ICD-10-CM | POA: Diagnosis not present

## 2016-01-30 DIAGNOSIS — I4891 Unspecified atrial fibrillation: Secondary | ICD-10-CM | POA: Diagnosis present

## 2016-01-30 DIAGNOSIS — R609 Edema, unspecified: Secondary | ICD-10-CM | POA: Diagnosis not present

## 2016-01-30 DIAGNOSIS — I248 Other forms of acute ischemic heart disease: Secondary | ICD-10-CM | POA: Diagnosis not present

## 2016-01-30 DIAGNOSIS — R601 Generalized edema: Secondary | ICD-10-CM | POA: Diagnosis not present

## 2016-01-30 DIAGNOSIS — R0602 Shortness of breath: Secondary | ICD-10-CM | POA: Diagnosis present

## 2016-01-30 DIAGNOSIS — R079 Chest pain, unspecified: Secondary | ICD-10-CM | POA: Diagnosis not present

## 2016-01-30 LAB — BASIC METABOLIC PANEL
ANION GAP: 12 (ref 5–15)
BUN: 16 mg/dL (ref 6–20)
CALCIUM: 8.9 mg/dL (ref 8.9–10.3)
CHLORIDE: 101 mmol/L (ref 101–111)
CO2: 23 mmol/L (ref 22–32)
Creatinine, Ser: 0.98 mg/dL (ref 0.44–1.00)
GFR calc non Af Amer: >60 mL/min (ref >60)
GLUCOSE: 128 mg/dL — AB (ref 65–99)
Potassium: 4 mmol/L (ref 3.5–5.1)
Sodium: 136 mmol/L (ref 135–145)

## 2016-01-30 LAB — RESPIRATORY PANEL BY PCR
ADENOVIRUS-RVPPCR: NOT DETECTED
Bordetella pertussis: NOT DETECTED
CORONAVIRUS HKU1-RVPPCR: NOT DETECTED
CORONAVIRUS NL63-RVPPCR: NOT DETECTED
CORONAVIRUS OC43-RVPPCR: NOT DETECTED
Chlamydophila pneumoniae: NOT DETECTED
Coronavirus 229E: NOT DETECTED
Influenza A: NOT DETECTED
Influenza B: NOT DETECTED
METAPNEUMOVIRUS-RVPPCR: NOT DETECTED
Mycoplasma pneumoniae: NOT DETECTED
PARAINFLUENZA VIRUS 2-RVPPCR: NOT DETECTED
PARAINFLUENZA VIRUS 3-RVPPCR: NOT DETECTED
Parainfluenza Virus 1: NOT DETECTED
Parainfluenza Virus 4: NOT DETECTED
RHINOVIRUS / ENTEROVIRUS - RVPPCR: NOT DETECTED
Respiratory Syncytial Virus: NOT DETECTED

## 2016-01-30 LAB — CBC
HCT: 46.1 % — ABNORMAL HIGH (ref 36.0–46.0)
HEMOGLOBIN: 15.5 g/dL — AB (ref 12.0–15.0)
MCH: 29.6 pg (ref 26.0–34.0)
MCHC: 33.6 g/dL (ref 30.0–36.0)
MCV: 88.1 fL (ref 78.0–100.0)
PLATELETS: 232 10*3/uL (ref 150–400)
RBC: 5.23 MIL/uL — AB (ref 3.87–5.11)
RDW: 14.4 % (ref 11.5–15.5)
WBC: 12.6 10*3/uL — ABNORMAL HIGH (ref 4.0–10.5)

## 2016-01-30 LAB — LIPID PANEL
CHOL/HDL RATIO: 2.6 ratio
Cholesterol: 107 mg/dL (ref 0–200)
HDL: 41 mg/dL (ref >40)
LDL CALC: 54 mg/dL (ref 0–99)
Triglycerides: 61 mg/dL (ref <150)
VLDL: 12 mg/dL (ref 0–40)

## 2016-01-30 LAB — INFLUENZA PANEL BY PCR (TYPE A & B)
INFLAPCR: NEGATIVE
Influenza B By PCR: NEGATIVE

## 2016-01-30 LAB — TROPONIN I
TROPONIN I: 0.04 ng/mL — AB (ref <0.03)
Troponin I: 0.04 ng/mL (ref <0.03)

## 2016-01-30 LAB — URINALYSIS, ROUTINE W REFLEX MICROSCOPIC
Bilirubin Urine: NEGATIVE
GLUCOSE, UA: NEGATIVE mg/dL
Hgb urine dipstick: NEGATIVE
KETONES UR: NEGATIVE mg/dL
Leukocytes, UA: NEGATIVE
NITRITE: NEGATIVE
PROTEIN: 100 mg/dL — AB
Specific Gravity, Urine: 1.02 (ref 1.005–1.030)
pH: 5 (ref 5.0–8.0)

## 2016-01-30 LAB — MRSA PCR SCREENING: MRSA BY PCR: NEGATIVE

## 2016-01-30 LAB — STREP PNEUMONIAE URINARY ANTIGEN: STREP PNEUMO URINARY ANTIGEN: NEGATIVE

## 2016-01-30 LAB — HEPARIN LEVEL (UNFRACTIONATED): Heparin Unfractionated: 0.3 IU/mL (ref 0.30–0.70)

## 2016-01-30 LAB — HIV ANTIBODY (ROUTINE TESTING W REFLEX): HIV Screen 4th Generation wRfx: NONREACTIVE

## 2016-01-30 MED ORDER — METOPROLOL TARTRATE 25 MG PO TABS
25.0000 mg | ORAL_TABLET | Freq: Two times a day (BID) | ORAL | Status: DC
  Administered 2016-01-30 (×2): 25 mg via ORAL
  Filled 2016-01-30 (×3): qty 1

## 2016-01-30 MED ORDER — APIXABAN 5 MG PO TABS
5.0000 mg | ORAL_TABLET | Freq: Two times a day (BID) | ORAL | Status: DC
  Administered 2016-01-30 – 2016-02-04 (×11): 5 mg via ORAL
  Filled 2016-01-30 (×11): qty 1

## 2016-01-30 NOTE — Progress Notes (Signed)
Patient Name: Briana Kane Date of Encounter: 01/30/2016  Primary Cardiologist: New, Dr. [facility] Problem List     Active Problems:   Atrial fibrillation with RVR [facility])   Community acquired pneumonia   Obesity   Bilateral leg edema     Subjective   Feels ok, didn't sleep much last night. Denies palpitations and chest pain.   Inpatient Medications    Scheduled Meds: . aspirin EC  81 mg Oral Daily  . azithromycin  500 mg Oral Q24H  . cefTRIAXone (ROCEPHIN)  IV  1 g Intravenous Q24H  . furosemide  40 mg Intravenous BID   Continuous Infusions: . amiodarone 30 mg/hr (01/30/16 0740)  . diltiazem (CARDIZEM) infusion Stopped (01/29/16 2159)  . diltiazem (CARDIZEM) infusion 15 mg/hr (01/30/16 0539)  . heparin 1,350 Units/hr (01/29/16 1841)   PRN Meds: acetaminophen, ondansetron (ZOFRAN) IV   Vital Signs    Vitals:   01/30/16 0451 01/30/16 0500 01/30/16 0520 01/30/16 0827  BP: 122/79  (!) 133/107   Pulse: (!) 164 (!) 125 (!) 117 (!) 101  Resp: (!) 26 (!) 21 (!) 33 (!) 23  Temp:  97.7 F (36.5 C)  98.1 F (36.7 C)  TempSrc:  Oral  Oral  SpO2: 95% 95% 95% 97%  Weight:  (!) 317 lb 11.2 oz (144.1 kg)    Height:        Intake/Output Summary (Last 24 hours) at 01/30/16 0834 Last data filed at 01/30/16 0600  Gross per 24 hour  Intake           835.21 ml  Output              825 ml  Net            10.21 ml   Filed Weights   01/29/16 1202 01/30/16 0047 01/30/16 0500  Weight: (!) 312 lb (141.5 kg) (!) 318 lb 4.8 oz (144.4 kg) (!) 317 lb 11.2 oz (144.1 kg)    Physical Exam    GEN: Well nourished, well developed, in no acute distress.  HEENT: Grossly normal.  Neck: Supple, no JVD, carotid bruits, or masses. Cardiac: irreg. Irregular rhythm, no murmurs, rubs, or gallops. No clubbing, cyanosis,  +1 pedal edema.  Radials/DP/PT 2+ and equal bilaterally.  Respiratory:  Respirations regular and unlabored, expiratory wheezing in upper lobes.  GI: Soft,  nontender, nondistended, BS + x 4. MS: no deformity or atrophy. Skin: warm and dry, no rash. Neuro:  Strength and sensation are intact. Psych: AAOx3.  Normal affect.  Labs    CBC  Recent Labs  01/29/16 1316 01/30/16 0229  WBC 11.8* 12.6*  HGB 16.3* 15.5*  HCT 48.1* 46.1*  MCV 89.1 88.1  PLT 243 232   Basic Metabolic Panel  Recent Labs  01/29/16 1316 01/30/16 0229  NA 137 136  K 3.9 4.0  CL 102 101  CO2 21* 23  GLUCOSE 113* 128*  BUN 12 16  CREATININE 0.89 0.98  CALCIUM 9.2 8.9   Liver Function Tests  Recent Labs  01/29/16 1316  AST 35  ALT 27  ALKPHOS 62  BILITOT 1.8*  PROT 7.3  ALBUMIN 3.6   Cardiac Enzymes  Recent Labs  01/29/16 2245 01/30/16 0229  TROPONINI 0.03* 0.04*   Fasting Lipid Panel  Recent Labs  01/30/16 0229  CHOL 107  HDL 41  LDLCALC 54  TRIG 61  CHOLHDL 2.6   Thyroid Function Tests  Recent Labs  01/29/16 2245  TSH 2.363  Telemetry    Afib, rates elevated in the 100's.  - Personally Reviewed  ECG     Afib RVR- Personally Reviewed  Radiology    Dg Chest Portable 1 View  Result Date: 01/29/2016 CLINICAL DATA:  66 year old female with shortness of breath and cough for 2 weeks. Former smoker. Initial encounter. EXAM: PORTABLE CHEST 1 VIEW COMPARISON:  None. FINDINGS: Portable AP upright view at 1249 hours. Suggestion of cardiomegaly but other mediastinal contours appear normal. Veiling opacity at both lung bases. No superimposed pneumothorax or definite pulmonary edema. No other confluent pulmonary opacity. Negative visible bowel gas pattern in the upper abdomen. IMPRESSION: 1. Veiling bibasilar pulmonary opacity is nonspecific but might relate to bilateral pleural effusions. PA and lateral views of the chest recommended when possible. 2. Suggestion of cardiomegaly.  No acute pulmonary edema. Electronically Signed   By: Odessa Fleming M.D.   On: 01/29/2016 13:06      Patient Profile     Briana Kane is a 66 year old  female with no past medical history who presented to the ED on 01/29/16 with CAP and rapid Afib.   Assessment & Plan    1. Atrial fibrillation with RVR: Presents with fatigue, cough and found to be in Afib. On diltiazem gtt, Amiodarone added last night as her rates were elevated. They remain elevated this morning. Would stop Diltiazem and continue Amio. Looks like metoprolol was not added yesterday, will start 25mg  metoprolol BID today.   This patients CHA2DS2-VASc Score and unadjusted Ischemic Stroke Rate (% per year) is equal to 2.2 % stroke rate/year from a score of 2 Above score calculated as 1 point each if present [CHF, HTN, DM, Vascular=MI/PAD/Aortic Plaque, Age if 71-74, or Female], 2 points each if present [Age > 75, or Stroke/TIA/TE]  She has a CHA2DS2-VASc score of 2 for gender only, and we talked about anticoagulation but she prefers to use natural herbs, however after talking with her she is open to starting Eliquis. Can start today and discontinue heparin.   Her BNP is elevated and she has bilateral lower extremity edema, will get Echo. I fear that she has some degree of LV dysfunction from her Afib.   Will need to readdress her meds once we get her Echo. She would benefit from DCCV but cannot preform if she doesn't agree to anticoagulation.   TSH normal.   2. CAP: Per primary team.  3. Bilateral lower extremity edema: Continue IV diuresis.   Signed, Little Ishikawa, NP  01/30/2016, 8:34 AM   I have personally seen and examined this patient with Suzzette Righter, NP. I agree with the assessment and plan as outlined above. She is still in atrial fib with RVR. She is being loaded with IV amiodarone. Will add Lopressor. Will start eliquis today. Plan to continue Eliquis for now. If she does not convert to sinus, will plan TEE guided DCCV on Thursday after loading with amiodarone. Continue diuresis with IV Lasix for volume overload. Echo today.   Verne Carrow 01/30/2016 10:12  AM

## 2016-01-30 NOTE — Progress Notes (Addendum)
ANTICOAGULATION CONSULT NOTE   Pharmacy Consult for heparin  Indication: atrial fibrillation  Allergies  Allergen Reactions  . Gluten Meal Shortness Of Breath and Other (See Comments)    Triggered fluid around her heart a couple of years ago (NO BREAD OR PASTA)  . Wheat Bran Shortness Of Breath    Also, possible edema (around heart)  . Mold Extract [Trichophyton] Other (See Comments)    Causes congestion (sudden)  . Neosporin [Neomycin-Bacitracin Zn-Polymyx] Rash  . Other Rash    Neoprene (NO!)  . Petroleum Jelly Lip Treatment [Dimethicone] Rash  . Tape Rash    Patient Measurements: Height: 5\' 7"  (170.2 cm) Weight: (!) 318 lb 4.8 oz (144.4 kg) IBW/kg (Calculated) : 61.6 Heparin Dosing Weight: 96.4 kg  Vital Signs: BP: 115/99 (01/08 2330) Pulse Rate: 146 (01/08 2330)  Labs:  Recent Labs  01/29/16 1316 01/29/16 2245 01/29/16 2251  HGB 16.3*  --   --   HCT 48.1*  --   --   PLT 243  --   --   LABPROT 17.5*  --   --   INR 1.42  --   --   HEPARINUNFRC  --   --  0.35  CREATININE 0.89  --   --   TROPONINI  --  0.03*  --     Estimated Creatinine Clearance: 96.7 mL/min (by C-G formula based on SCr of 0.89 mg/dL).   Assessment: 66 yo female presents with fatigue and SOB. Found to have new onset AFib with RVR. Pt is not on any anticoagulants prior to admission. Pharmacy consulted to dose heparin -Initial heparin level= 0.35  Goal of Therapy:  Heparin level 0.3-0.7 units/ml Monitor platelets by anticoagulation protocol: Yes    Plan:  -No heparin changes -Heparin level daily wth CBC daily  Harland German, Pharm D 01/30/2016 12:49 AM

## 2016-01-30 NOTE — Progress Notes (Signed)
PROGRESS NOTE                                                                                                                                                                                                             Patient Demographics:    Briana Kane, is a 66 y.o. female, DOB - Oct 25, 1950, [id number]  Admit date - 01/29/2016   Admitting Physician Starleen Arms, MD  Outpatient Primary MD for the patient is Pcp Not In System  LOS - 0  Chief Complaint  Patient presents with  . Shortness of Breath       Brief Narrative  66 y.o. female, Without significant past medical history, does not follow with any PCP, he was complaints of cough and shortness of breath, workup significant for new onset atrial fibrillation, CAP, and volume overload.   Subjective:    Mylo Kowalke today has, No headache, No chest pain, No abdominal pain - A. fib uncontrolled on Cardizem drip yesterday, amiodarone drip was added.  Assessment  & Plan :    Active Problems:   Atrial fibrillation with RVR (HCC)   Community acquired pneumonia   Obesity   Bilateral leg edema   A fib with RVR - Heartrate was uncontrolled overnight, amiodarone was added diltiazem drip, management per cardiology, diltiazem drip stopped, continue with amiodarone drip to outpatient, started on metoprolol 25 mg twice a day. - Initially on heparin GTT, transitioned to Eliquis. - Plan for TEE guided DC CV on Thursday if she does not convert to sinus per cardiology.   Volume overload/possible CHF - Presents with bilateral lower extremity edema, and elevated BNP, possible CHF, possibly related to A. fib with RVR - Diuresis per cardiology - Follow on 2-D echo  CAP - Presents with cough, productive sputum, bibasilar infiltrate, - continue with rocephin and azithromycin. - Follow on influenza and respiratory panel    Code Status : full  Family Communication  : none at  bedside  Disposition Plan  : Home when stable  Consults  :  cardiolgoy  Procedures  : none  DVT Prophylaxis  :  Heparin GTT >> Eliquis  Lab Results  Component Value Date   PLT 232 01/30/2016    Antibiotics  :    Anti-infectives    Start     Dose/Rate Route Frequency Ordered Stop  01/30/16 1600  azithromycin (ZITHROMAX) tablet 500 mg     500 mg Oral Every 24 hours 01/29/16 2051 02/06/16 1559   01/30/16 1600  cefTRIAXone (ROCEPHIN) 1 g in dextrose 5 % 50 mL IVPB     1 g 100 mL/hr over 30 Minutes Intravenous Every 24 hours 01/29/16 2051 02/06/16 1559   01/29/16 1530  cefTRIAXone (ROCEPHIN) 1 g in dextrose 5 % 50 mL IVPB     1 g 100 mL/hr over 30 Minutes Intravenous  Once 01/29/16 1518 01/29/16 1703   01/29/16 1530  azithromycin (ZITHROMAX) 500 mg in dextrose 5 % 250 mL IVPB     500 mg 250 mL/hr over 60 Minutes Intravenous  Once 01/29/16 1518 01/29/16 1842        Objective:   Vitals:   01/30/16 0451 01/30/16 0500 01/30/16 0520 01/30/16 0827  BP: 122/79  (!) 133/107   Pulse: (!) 164 (!) 125 (!) 117 (!) 101  Resp: (!) 26 (!) 21 (!) 33 (!) 23  Temp:  97.7 F (36.5 C)  98.1 F (36.7 C)  TempSrc:  Oral  Oral  SpO2: 95% 95% 95% 97%  Weight:  (!) 144.1 kg (317 lb 11.2 oz)    Height:        Wt Readings from Last 3 Encounters:  01/30/16 (!) 144.1 kg (317 lb 11.2 oz)     Intake/Output Summary (Last 24 hours) at 01/30/16 1102 Last data filed at 01/30/16 1000  Gross per 24 hour  Intake          1075.21 ml  Output             1325 ml  Net          -249.79 ml     Physical Exam  Awake Alert, Oriented X 3, No new F.N deficits, Normal affect Supple Neck,No JVD,  Symmetrical Chest wall movement, Good air movement bilaterally, no wheezing IRR IRR,No Gallops,Rubs or new Murmurs, No Parasternal Heave +ve B.Sounds, Abd Soft, No tenderness, No rebound - guarding or rigidity. No Cyanosis, Clubbing N,o new Rash or bruise  , +1 edema    Data Review:    CBC  Recent  Labs Lab 01/29/16 1316 01/30/16 0229  WBC 11.8* 12.6*  HGB 16.3* 15.5*  HCT 48.1* 46.1*  PLT 243 232  MCV 89.1 88.1  MCH 30.2 29.6  MCHC 33.9 33.6  RDW 14.8 14.4    Chemistries   Recent Labs Lab 01/29/16 1316 01/30/16 0229  NA 137 136  K 3.9 4.0  CL 102 101  CO2 21* 23  GLUCOSE 113* 128*  BUN 12 16  CREATININE 0.89 0.98  CALCIUM 9.2 8.9  AST 35  --   ALT 27  --   ALKPHOS 62  --   BILITOT 1.8*  --    ------------------------------------------------------------------------------------------------------------------  Recent Labs  01/30/16 0229  CHOL 107  HDL 41  LDLCALC 54  TRIG 61  CHOLHDL 2.6    No results found for: HGBA1C ------------------------------------------------------------------------------------------------------------------  Recent Labs  01/29/16 2245  TSH 2.363   ------------------------------------------------------------------------------------------------------------------ No results for input(s): VITAMINB12, FOLATE, FERRITIN, TIBC, IRON, RETICCTPCT in the last 72 hours.  Coagulation profile  Recent Labs Lab 01/29/16 1316  INR 1.42    No results for input(s): DDIMER in the last 72 hours.  Cardiac Enzymes  Recent Labs Lab 01/29/16 2245 01/30/16 0229 01/30/16 0921  TROPONINI 0.03* 0.04* 0.04*   ------------------------------------------------------------------------------------------------------------------    Component Value Date/Time   BNP 513.8 (H) 01/29/2016  1316    Inpatient Medications  Scheduled Meds: . apixaban  5 mg Oral BID  . aspirin EC  81 mg Oral Daily  . azithromycin  500 mg Oral Q24H  . cefTRIAXone (ROCEPHIN)  IV  1 g Intravenous Q24H  . furosemide  40 mg Intravenous BID  . metoprolol tartrate  25 mg Oral BID   Continuous Infusions: . amiodarone 30 mg/hr (01/30/16 0740)   PRN Meds:.acetaminophen, ondansetron (ZOFRAN) IV  Micro Results Recent Results (from the past 240 hour(s))  MRSA PCR  Screening     Status: None   Collection Time: 01/30/16  1:07 AM  Result Value Ref Range Status   MRSA by PCR NEGATIVE NEGATIVE Final    Comment:        The GeneXpert MRSA Assay (FDA approved for NASAL specimens only), is one component of a comprehensive MRSA colonization surveillance program. It is not intended to diagnose MRSA infection nor to guide or monitor treatment for MRSA infections.     Radiology Reports Dg Chest Portable 1 View  Result Date: 01/29/2016 CLINICAL DATA:  66 year old female with shortness of breath and cough for 2 weeks. Former smoker. Initial encounter. EXAM: PORTABLE CHEST 1 VIEW COMPARISON:  None. FINDINGS: Portable AP upright view at 1249 hours. Suggestion of cardiomegaly but other mediastinal contours appear normal. Veiling opacity at both lung bases. No superimposed pneumothorax or definite pulmonary edema. No other confluent pulmonary opacity. Negative visible bowel gas pattern in the upper abdomen. IMPRESSION: 1. Veiling bibasilar pulmonary opacity is nonspecific but might relate to bilateral pleural effusions. PA and lateral views of the chest recommended when possible. 2. Suggestion of cardiomegaly.  No acute pulmonary edema. Electronically Signed   By: Odessa Fleming M.D.   On: 01/29/2016 13:06    Coleston Dirosa M.D on 01/30/2016 at 11:02 AM  Between 7am to 7pm - Pager - [phone]  After 7pm go to [email or web address] - password [facility]  [facility] -  Office  [phone]

## 2016-01-30 NOTE — Progress Notes (Signed)
Pt maxed out on Cardizem and still with high HR. Amiodarone drip added to regimen. Cardiology fellow called and agreed with plan.  Will follow.  KJKG, NP [facility]

## 2016-01-30 NOTE — Discharge Instructions (Addendum)
Atrial Fibrillation Atrial fibrillation is a type of irregular or rapid heartbeat (arrhythmia). In atrial fibrillation, the heart quivers continuously in a chaotic pattern. This occurs when parts of the heart receive disorganized signals that make the heart unable to pump blood normally. This can increase the risk for stroke, heart failure, and other heart-related conditions. There are different types of atrial fibrillation, including:  Paroxysmal atrial fibrillation. This type starts suddenly, and it usually stops on its own shortly after it starts.  Persistent atrial fibrillation. This type often lasts longer than a week. It may stop on its own or with treatment.  Long-lasting persistent atrial fibrillation. This type lasts longer than 12 months.  Permanent atrial fibrillation. This type does not go away. Talk with your health care provider to learn about the type of atrial fibrillation that you have. What are the causes? This condition is caused by some heart-related conditions or procedures, including:  A heart attack.  Coronary artery disease.  Heart failure.  Heart valve conditions.  High blood pressure.  Inflammation of the sac that surrounds the heart (pericarditis).  Heart surgery.  Certain heart rhythm disorders, such as Wolf-Parkinson-White syndrome. Other causes include:  Pneumonia.  Obstructive sleep apnea.  Blockage of an artery in the lungs (pulmonary embolism, or PE).  Lung cancer.  Chronic lung disease.  Thyroid problems, especially if the thyroid is overactive (hyperthyroidism).  Caffeine.  Excessive alcohol use or illegal drug use.  Use of some medicines, including certain decongestants and diet pills. Sometimes, the cause cannot be found. What increases the risk? This condition is more likely to develop in:  People who are older in age.  People who smoke.  People who have diabetes mellitus.  People who are overweight (obese).  Athletes  who exercise vigorously. What are the signs or symptoms? Symptoms of this condition include:  A feeling that your heart is beating rapidly or irregularly.  A feeling of discomfort or pain in your chest.  Shortness of breath.  Sudden light-headedness or weakness.  Getting tired easily during exercise. In some cases, there are no symptoms. How is this diagnosed? Your health care provider may be able to detect atrial fibrillation when taking your pulse. If detected, this condition may be diagnosed with:  An electrocardiogram (ECG).  A Holter monitor test that records your heartbeat patterns over a 24-hour period.  Transthoracic echocardiogram (TTE) to evaluate how blood flows through your heart.  Transesophageal echocardiogram (TEE) to view more detailed images of your heart.  A stress test.  Imaging tests, such as a CT scan or chest X-ray.  Blood tests. How is this treated? The main goals of treatment are to prevent blood clots from forming and to keep your heart beating at a normal rate and rhythm. The type of treatment that you receive depends on many factors, such as your underlying medical conditions and how you feel when you are experiencing atrial fibrillation. This condition may be treated with:  Medicine to slow down the heart rate, bring the hearts rhythm back to normal, or prevent clots from forming.  Electrical cardioversion. This is a procedure that resets your hearts rhythm by delivering a controlled, low-energy shock to the heart through your skin.  Different types of ablation, such as catheter ablation, catheter ablation with pacemaker, or surgical ablation. These procedures destroy the heart tissues that send abnormal signals. When the pacemaker is used, it is placed under your skin to help your heart beat in a regular rhythm. Follow these instructions  at home:  Take over-the counter and prescription medicines only as told by your health care provider.  If  your health care provider prescribed a blood-thinning medicine (anticoagulant), take it exactly as told. Taking too much blood-thinning medicine can cause bleeding. If you do not take enough blood-thinning medicine, you will not have the protection that you need against stroke and other problems.  Do not use tobacco products, including cigarettes, chewing tobacco, and e-cigarettes. If you need help quitting, ask your health care provider.  If you have obstructive sleep apnea, manage your condition as told by your health care provider.  Do not drink alcohol.  Do not drink beverages that contain caffeine, such as coffee, soda, and tea.  Maintain a healthy weight. Do not use diet pills unless your health care provider approves. Diet pills may make heart problems worse.  Follow diet instructions as told by your health care provider.  Exercise regularly as told by your health care provider.  Keep all follow-up visits as told by your health care provider. This is important. How is this prevented?  Avoid drinking beverages that contain caffeine or alcohol.  Avoid certain medicines, especially medicines that are used for breathing problems.  Avoid certain herbs and herbal medicines, such as those that contain ephedra or ginseng.  Do not use illegal drugs, such as cocaine and amphetamines.  Do not smoke.  Manage your high blood pressure. Contact a health care provider if:  You notice a change in the rate, rhythm, or strength of your heartbeat.  You are taking an anticoagulant and you notice increased bruising.  You tire more easily when you exercise or exert yourself. Get help right away if:  You have chest pain, abdominal pain, sweating, or weakness.  You feel nauseous.  You notice blood in your vomit, bowel movement, or urine.  You have shortness of breath.  You suddenly have swollen feet and ankles.  You feel dizzy.  You have sudden weakness or numbness of the face, arm,  or leg, especially on one side of the body.  You have trouble speaking, trouble understanding, or both (aphasia).  Your face or your eyelid droops on one side. These symptoms may represent a serious problem that is an emergency. Do not wait to see if the symptoms will go away. Get medical help right away. Call your local emergency services (911 in the [place] Do not drive yourself to the hospital.  This information is not intended to replace advice given to you by your health care provider. Make sure you discuss any questions you have with your health care provider. Document Released: 01/07/2005 Document Revised: 05/17/2015 Document Reviewed: 05/04/2014 Elsevier Interactive Patient Education  2017 [employer]. Information on my medicine - ELIQUIS (apixaban)  This medication education was reviewed with me or my healthcare representative as part of my discharge preparation.  The pharmacist that spoke with me during my hospital stay was:  Dixie Dials, PharmD.  Why was Eliquis prescribed for you? Eliquis was prescribed for you to reduce the risk of a blood clot forming that can cause a stroke if you have a medical condition called atrial fibrillation (a type of irregular heartbeat).  What do You need to know about Eliquis ? Take your Eliquis TWICE DAILY - one tablet in the morning and one tablet in the evening with or without food. If you have difficulty swallowing the tablet whole please discuss with your pharmacist how to take the medication safely.  Take Eliquis exactly as prescribed  by your doctor and DO NOT stop taking Eliquis without talking to the doctor who prescribed the medication.  Stopping may increase your risk of developing a stroke.  Refill your prescription before you run out.  After discharge, you should have regular check-up appointments with your healthcare provider that is prescribing your Eliquis.  In the future your dose may need to be changed if your kidney  function or weight changes by a significant amount or as you get older.  What do you do if you miss a dose? If you miss a dose, take it as soon as you remember on the same day and resume taking twice daily.  Do not take more than one dose of ELIQUIS at the same time to make up a missed dose.  Important Safety Information A possible side effect of Eliquis is bleeding. You should call your healthcare provider right away if you experience any of the following: ? Bleeding from an injury or your nose that does not stop. ? Unusual colored urine (red or dark brown) or unusual colored stools (red or black). ? Unusual bruising for unknown reasons. ? A serious fall or if you hit your head (even if there is no bleeding).  Some medicines may interact with Eliquis and might increase your risk of bleeding or clotting while on Eliquis. To help avoid this, consult your healthcare provider or pharmacist prior to using any new prescription or non-prescription medications, including herbals, vitamins, non-steroidal anti-inflammatory drugs (NSAIDs) and supplements.  This website has more information on Eliquis (apixaban): http://www.eliquis.com/eliquis/home

## 2016-01-30 NOTE — Progress Notes (Signed)
ANTICOAGULATION CONSULT NOTE - Initial Consult  Pharmacy Consult for Apixaban Indication: atrial fibrillation  Allergies  Allergen Reactions  . Gluten Meal Shortness Of Breath and Other (See Comments)    Triggered fluid around her heart a couple of years ago (NO BREAD OR PASTA)  . Wheat Bran Shortness Of Breath    Also, possible edema (around heart)  . Mold Extract [Trichophyton] Other (See Comments)    Causes congestion (sudden)  . Neosporin [Neomycin-Bacitracin Zn-Polymyx] Rash  . Other Rash    Neoprene (NO!)  . Petroleum Jelly Lip Treatment [Dimethicone] Rash  . Tape Rash    Patient Measurements: Height: 5\' 7"  (170.2 cm) Weight: (!) 317 lb 11.2 oz (144.1 kg) IBW/kg (Calculated) : 61.6  Vital Signs: Temp: 98.1 F (36.7 C) (01/09 0827) Temp Source: Oral (01/09 0827) BP: 133/107 (01/09 0520) Pulse Rate: 101 (01/09 0827)  Labs:  Recent Labs  01/29/16 1316 01/29/16 2245 01/29/16 2251 01/30/16 0115 01/30/16 0229 01/30/16 0921  HGB 16.3*  --   --   --  15.5*  --   HCT 48.1*  --   --   --  46.1*  --   PLT 243  --   --   --  232  --   LABPROT 17.5*  --   --   --   --   --   INR 1.42  --   --   --   --   --   HEPARINUNFRC  --   --  0.35 0.30  --   --   CREATININE 0.89  --   --   --  0.98  --   TROPONINI  --  0.03*  --   --  0.04* 0.04*    Estimated Creatinine Clearance: 87.7 mL/min (by C-G formula based on SCr of 0.98 mg/dL).   Medical History: History reviewed. No pertinent past medical history.  Medications:  Scheduled:  . apixaban  5 mg Oral BID  . azithromycin  500 mg Oral Q24H  . cefTRIAXone (ROCEPHIN)  IV  1 g Intravenous Q24H  . furosemide  40 mg Intravenous BID  . metoprolol tartrate  25 mg Oral BID   Infusions:  . amiodarone 30 mg/hr (01/30/16 0740)    Assessment: 66 yo F initially placed on heparin for new onset afib. To transition today to Eliquis. CBC stable.  Goal of Therapy:  therapeutic anticoagulation Monitor platelets by  anticoagulation protocol: Yes   Plan:  Apixaban 5mg  PO BID. (age <80, wt >60kg, SCr <1.5)   Jobe Mutch, Pharm.D., BCPS Clinical Pharmacist Pager [phone] 01/30/2016 11:50 AM

## 2016-01-31 ENCOUNTER — Inpatient Hospital Stay (HOSPITAL_COMMUNITY): Payer: BLUE CROSS/BLUE SHIELD

## 2016-01-31 DIAGNOSIS — R0602 Shortness of breath: Secondary | ICD-10-CM

## 2016-01-31 DIAGNOSIS — R0600 Dyspnea, unspecified: Secondary | ICD-10-CM | POA: Diagnosis present

## 2016-01-31 DIAGNOSIS — R601 Generalized edema: Secondary | ICD-10-CM

## 2016-01-31 DIAGNOSIS — E6609 Other obesity due to excess calories: Secondary | ICD-10-CM

## 2016-01-31 DIAGNOSIS — R079 Chest pain, unspecified: Secondary | ICD-10-CM

## 2016-01-31 DIAGNOSIS — R600 Localized edema: Secondary | ICD-10-CM

## 2016-01-31 LAB — BASIC METABOLIC PANEL
ANION GAP: 10 (ref 5–15)
BUN: 18 mg/dL (ref 6–20)
CALCIUM: 8.8 mg/dL — AB (ref 8.9–10.3)
CO2: 31 mmol/L (ref 22–32)
Chloride: 97 mmol/L — ABNORMAL LOW (ref 101–111)
Creatinine, Ser: 0.92 mg/dL (ref 0.44–1.00)
Glucose, Bld: 111 mg/dL — ABNORMAL HIGH (ref 65–99)
Potassium: 3.9 mmol/L (ref 3.5–5.1)
SODIUM: 138 mmol/L (ref 135–145)

## 2016-01-31 LAB — CBC
HEMATOCRIT: 47.2 % — AB (ref 36.0–46.0)
Hemoglobin: 15.3 g/dL — ABNORMAL HIGH (ref 12.0–15.0)
MCH: 29.4 pg (ref 26.0–34.0)
MCHC: 32.4 g/dL (ref 30.0–36.0)
MCV: 90.6 fL (ref 78.0–100.0)
Platelets: 207 10*3/uL (ref 150–400)
RBC: 5.21 MIL/uL — ABNORMAL HIGH (ref 3.87–5.11)
RDW: 14.8 % (ref 11.5–15.5)
WBC: 10.4 10*3/uL (ref 4.0–10.5)

## 2016-01-31 LAB — ECHOCARDIOGRAM COMPLETE
Height: 67 in
Weight: 5027.2 oz

## 2016-01-31 LAB — LEGIONELLA PNEUMOPHILA SEROGP 1 UR AG: L. PNEUMOPHILA SEROGP 1 UR AG: NEGATIVE

## 2016-01-31 LAB — HEMOGLOBIN A1C
Hgb A1c MFr Bld: 6 % — ABNORMAL HIGH (ref 4.8–5.6)
MEAN PLASMA GLUCOSE: 126 mg/dL

## 2016-01-31 MED ORDER — FUROSEMIDE 20 MG PO TABS
20.0000 mg | ORAL_TABLET | Freq: Two times a day (BID) | ORAL | Status: DC
  Administered 2016-01-31 – 2016-02-02 (×6): 20 mg via ORAL
  Filled 2016-01-31 (×6): qty 1

## 2016-01-31 MED ORDER — METOPROLOL TARTRATE 50 MG PO TABS
50.0000 mg | ORAL_TABLET | Freq: Two times a day (BID) | ORAL | Status: DC
  Administered 2016-01-31 – 2016-02-04 (×8): 50 mg via ORAL
  Filled 2016-01-31 (×8): qty 1

## 2016-01-31 MED ORDER — ALPRAZOLAM 0.5 MG PO TABS
0.5000 mg | ORAL_TABLET | Freq: Three times a day (TID) | ORAL | Status: DC | PRN
  Administered 2016-01-31 – 2016-02-03 (×6): 0.5 mg via ORAL
  Filled 2016-01-31 (×6): qty 1

## 2016-01-31 MED ORDER — METOPROLOL TARTRATE 5 MG/5ML IV SOLN
5.0000 mg | INTRAVENOUS | Status: DC | PRN
  Administered 2016-01-31: 5 mg via INTRAVENOUS

## 2016-01-31 MED ORDER — METOPROLOL TARTRATE 5 MG/5ML IV SOLN
INTRAVENOUS | Status: AC
  Filled 2016-01-31: qty 5

## 2016-01-31 NOTE — Progress Notes (Signed)
Progress Note    JASELYNN TAMAS  UEA:[id number] DOB: August 26, 1950  DOA: 01/29/2016 PCP: Pcp Not In System    Brief Narrative:   Chief complaint: Follow-up atrial fibrillation  DARIANN HUCKABA is an 66 y.o. female Without significant past medical history, does not follow with any PCP, he was complaints of cough and shortness of breath, workup significant for new onset atrial fibrillation, CAP, and volume overload.  Assessment/Plan:   Principal problems:  A fib with RVR/demand ischemia/elevated troponins Heart rate continues to be uncontrolled with rates up into the 130s despite therapy. Currently being managed with an amiodarone drip and metoprolol 50 mg twice a day with 5 mg as needed every 5 minutes for heart rate greater than 110. Diltiazem was not particularly effective so this was discontinued. She was initially treated with heparin, but is now on Eliquis. She will undergo DC cardioversion tomorrow for definitive treatment. Mild elevation of troponins are likely related to demand ischemia in the setting of significant tachycardia. Trend is flat.  Volume overload/possible CHF Presents with bilateral lower extremity edema, and elevated BNP, possible CHF, possibly related to A. fib with RVR. Continue to diuresis needed. 2-D echo pending.  CAP Empirically treated with Rocephin/azithromycin. Chest x-ray reviewed and is as pictured below. Findings appear to be more consistent with CHF, with probable bilateral pleural effusions. Could consider repeat chest radiography with lateral films or just follow clinically. Respiratory virus panel was negative.    Family Communication/Anticipated D/C date and plan/Code Status   DVT prophylaxis: Eliquis ordered. Code Status: Full Code.  Family Communication: Friend updated at the bedside. Disposition Plan: Home in 24-48 hours depending on response to cardioversion.   Medical Consultants:    Cardiology   Procedures:     None  Anti-Infectives:    Rocephin 01/30/16--->  Azithromycin 01/30/16 --->  Subjective:   The patient reports that She continues to be symptomatic with breathlessness and dyspnea on exertion. Denies palpitations and chest pain. No nausea or vomiting. Bowels are moving.  Objective:    Vitals:   01/30/16 2131 01/31/16 0045 01/31/16 0500 01/31/16 0817  BP: (!) 116/95 (!) 125/108 111/89 115/87  Pulse: (!) 133 (!) 119 (!) 121 (!) 101  Resp:  (!) 29 (!) 34 (!) 21  Temp:  97.8 F (36.6 C) 97.9 F (36.6 C) 98.1 F (36.7 C)  TempSrc:  Oral Oral Oral  SpO2:  95% 99% 97%  Weight:   (!) 142.5 kg (314 lb 3.2 oz)   Height:        Intake/Output Summary (Last 24 hours) at 01/31/16 0844 Last data filed at 01/31/16 0300  Gross per 24 hour  Intake          1143.62 ml  Output             1350 ml  Net          -206.38 ml   Filed Weights   01/30/16 0047 01/30/16 0500 01/31/16 0500  Weight: (!) 144.4 kg (318 lb 4.8 oz) (!) 144.1 kg (317 lb 11.2 oz) (!) 142.5 kg (314 lb 3.2 oz)    Exam: General exam: Appears Anxious with breathlessness at times. Respiratory system: Clear to auscultation. Respiratory effort normal. Cardiovascular system: Heart sounds irregularly irregular/tachycardic. No JVD,  rubs, gallops or clicks. No murmurs. Gastrointestinal system: Abdomen is nondistended, soft and nontender. No organomegaly or masses felt. Normal bowel sounds heard. Central nervous system: Alert and oriented. No focal neurological deficits. Extremities: No clubbing,  or cyanosis. No edema. Skin: No rashes, lesions or ulcers. Psychiatry: Judgement and insight appear normal. Mood & affect appropriate.   Data Reviewed:   I have personally reviewed following labs and imaging studies:  Labs: Basic Metabolic Panel:  Recent Labs Lab 01/29/16 1316 01/30/16 0229  NA 137 136  K 3.9 4.0  CL 102 101  CO2 21* 23  GLUCOSE 113* 128*  BUN 12 16  CREATININE 0.89 0.98  CALCIUM 9.2 8.9    GFR Estimated Creatinine Clearance: 87.2 mL/min (by C-G formula based on SCr of 0.98 mg/dL). Liver Function Tests:  Recent Labs Lab 01/29/16 1316  AST 35  ALT 27  ALKPHOS 62  BILITOT 1.8*  PROT 7.3  ALBUMIN 3.6   No results for input(s): LIPASE, AMYLASE in the last 168 hours. No results for input(s): AMMONIA in the last 168 hours. Coagulation profile  Recent Labs Lab 01/29/16 1316  INR 1.42    CBC:  Recent Labs Lab 01/29/16 1316 01/30/16 0229 01/31/16 0707  WBC 11.8* 12.6* 10.4  HGB 16.3* 15.5* 15.3*  HCT 48.1* 46.1* 47.2*  MCV 89.1 88.1 90.6  PLT 243 232 207   Cardiac Enzymes:  Recent Labs Lab 01/29/16 2245 01/30/16 0229 01/30/16 0921  TROPONINI 0.03* 0.04* 0.04*   BNP (last 3 results) No results for input(s): PROBNP in the last 8760 hours. CBG: No results for input(s): GLUCAP in the last 168 hours. D-Dimer: No results for input(s): DDIMER in the last 72 hours. Hgb A1c:  Recent Labs  01/30/16 0115  HGBA1C 6.0*   Lipid Profile:  Recent Labs  01/30/16 0229  CHOL 107  HDL 41  LDLCALC 54  TRIG 61  CHOLHDL 2.6   Thyroid function studies:  Recent Labs  01/29/16 2245  TSH 2.363   Anemia work up: No results for input(s): VITAMINB12, FOLATE, FERRITIN, TIBC, IRON, RETICCTPCT in the last 72 hours. Sepsis Labs:  Recent Labs Lab 01/29/16 1316 01/30/16 0229 01/31/16 0707  WBC 11.8* 12.6* 10.4    Microbiology Recent Results (from the past 240 hour(s))  Respiratory Panel by PCR     Status: None   Collection Time: 01/29/16 11:42 PM  Result Value Ref Range Status   Adenovirus NOT DETECTED NOT DETECTED Final   Coronavirus 229E NOT DETECTED NOT DETECTED Final   Coronavirus HKU1 NOT DETECTED NOT DETECTED Final   Coronavirus NL63 NOT DETECTED NOT DETECTED Final   Coronavirus OC43 NOT DETECTED NOT DETECTED Final   Metapneumovirus NOT DETECTED NOT DETECTED Final   Rhinovirus / Enterovirus NOT DETECTED NOT DETECTED Final   Influenza A  NOT DETECTED NOT DETECTED Final   Influenza B NOT DETECTED NOT DETECTED Final   Parainfluenza Virus 1 NOT DETECTED NOT DETECTED Final   Parainfluenza Virus 2 NOT DETECTED NOT DETECTED Final   Parainfluenza Virus 3 NOT DETECTED NOT DETECTED Final   Parainfluenza Virus 4 NOT DETECTED NOT DETECTED Final   Respiratory Syncytial Virus NOT DETECTED NOT DETECTED Final   Bordetella pertussis NOT DETECTED NOT DETECTED Final   Chlamydophila pneumoniae NOT DETECTED NOT DETECTED Final   Mycoplasma pneumoniae NOT DETECTED NOT DETECTED Final  MRSA PCR Screening     Status: None   Collection Time: 01/30/16  1:07 AM  Result Value Ref Range Status   MRSA by PCR NEGATIVE NEGATIVE Final    Comment:        The GeneXpert MRSA Assay (FDA approved for NASAL specimens only), is one component of a comprehensive MRSA colonization surveillance program. It is  not intended to diagnose MRSA infection nor to guide or monitor treatment for MRSA infections.     Radiology: Dg Chest Portable 1 View  Result Date: 01/29/2016 CLINICAL DATA:  66 year old female with shortness of breath and cough for 2 weeks. Former smoker. Initial encounter. EXAM: PORTABLE CHEST 1 VIEW COMPARISON:  None. FINDINGS: Portable AP upright view at 1249 hours. Suggestion of cardiomegaly but other mediastinal contours appear normal. Veiling opacity at both lung bases. No superimposed pneumothorax or definite pulmonary edema. No other confluent pulmonary opacity. Negative visible bowel gas pattern in the upper abdomen. IMPRESSION: 1. Veiling bibasilar pulmonary opacity is nonspecific but might relate to bilateral pleural effusions. PA and lateral views of the chest recommended when possible. 2. Suggestion of cardiomegaly.  No acute pulmonary edema. Electronically Signed   By: Odessa Fleming M.D.   On: 01/29/2016 13:06    Medications:   . apixaban  5 mg Oral BID  . azithromycin  500 mg Oral Q24H  . cefTRIAXone (ROCEPHIN)  IV  1 g Intravenous Q24H  .  furosemide  40 mg Intravenous BID  . metoprolol tartrate  25 mg Oral BID   Continuous Infusions: . amiodarone 30 mg/hr (01/31/16 0226)    Medical decision making is of high complexity and this patient is at high risk of deterioration, therefore this is a level 3 visit.  (> 4 problem points, >4 data points, high risk)   Problems/DDx Points   Self limited or minor (max 2)       1   Established problem, stable       1   Established problem, worsening       2   New problem, no additional W/U planned (max 1)       3   New problem, additional W/U planned        4    Data Reviewed Points   Review/order clinical lab tests       1   Review/order x-rays       1   Review/order tests (Echo, EKG, PFTs, etc)       1   Discussion of test results w/ performing MD       1   Independent review of image, tracing or specimen       2   Decision to obtain old records       1   Review and summation of old records       2    Level of risk Presenting prob Diagnostics Management   Minimal 1 self limited/minor Labs CXR EKG/EEG U/A U/S Rest Gargles Bandages Dressings   Low 2 or more self limited/minor 1 stable chronic Acute uncomplicated illness Tests (PFTS) Non-CV imaging Arterial labs Biopsies of skin OTC drugs Minor surgery-no risk PT OT IVF without additives    Moderate 1 or more chronic illnesses w/ mild exac, progression or S/E from tx 2 or more stable chronic illnesses Undiagnosed new problem w/ uncertain prognosis Acute complicated injury  Stress tests Endoscopies with no risk factors Deep needle or incisional bx CV imaging without risk LP Thoracentesis Paracentesis Minor surgery w/ risks Elective major surgery w/ no risk (open, percutaneous or endoscopic) Prescription drugs Therapeutic nucl med IVF with additives Closed tx of fracture/dislocation    High Severe exac of chronic illness Acute or chronic illness/injury may pose a threat to life or bodily function  (ARF) Change in neuro status    CV imaging w/ contrast and risk Cardio electophysiologic tests  Endoscopies w/ risk Discography Elective major surgery Emergency major surgery Parenteral controlled substances Drug therapy req monitoring for toxicity DNR/de-escalation of care    MDM Prob points Data points Risk   Straightforward    <1    <1    Min   Low complexity    2    2    Low   Moderate    3    3    Mod   High Complexity    4 or more    4 or more    High      LOS: 1 day   RAMA,CHRISTINA  [facility] Pager [phone]. If unable to reach me by pager, please call my cell phone at [phone].  *Please refer to [email or web address], password [email or web address] to get updated schedule on who will round on this patient, as hospitalists switch teams weekly. If 7PM-7AM, please contact night-coverage at [email or web address], password [email or web address] for any overnight needs.  01/31/2016, 8:44 AM

## 2016-01-31 NOTE — Progress Notes (Signed)
[facility] has been requested to perform a transesophageal echocardiogram on 02/01/16 for atrial fibrillaton.  After careful review of history and examination, the risks and benefits of transesophageal echocardiogram have been explained including risks of esophageal damage, perforation (1:10,000 risk), bleeding, pharyngeal hematoma as well as other potential complications associated with conscious sedation including aspiration, arrhythmia, respiratory failure and death. Alternatives to treatment were discussed, questions were answered. Patient is willing to proceed.  TEE - Dr. Elease Hashimoto 02/01/15 @ 13:00 . NPO after midnight. Meds with sips ok   Briana Ishikawa, NP 01/31/2016 11:51 AM

## 2016-01-31 NOTE — Progress Notes (Signed)
Patient Name: Briana Kane Date of Encounter: 01/31/2016  Primary Cardiologist: New. [facility] Problem List     Active Problems:   Atrial fibrillation with RVR (HCC)   Community acquired pneumonia   Obesity   Bilateral leg edema     Subjective   Feels well, denies chest pain and SOB.   Inpatient Medications    Scheduled Meds: . apixaban  5 mg Oral BID  . azithromycin  500 mg Oral Q24H  . cefTRIAXone (ROCEPHIN)  IV  1 g Intravenous Q24H  . furosemide  40 mg Intravenous BID  . metoprolol tartrate  25 mg Oral BID   Continuous Infusions: . amiodarone 30 mg/hr (01/31/16 0226)   PRN Meds: acetaminophen, ondansetron (ZOFRAN) IV   Vital Signs    Vitals:   01/30/16 2131 01/31/16 0045 01/31/16 0500 01/31/16 0817  BP: (!) 116/95 (!) 125/108 111/89 115/87  Pulse: (!) 133 (!) 119 (!) 121 (!) 101  Resp:  (!) 29 (!) 34 (!) 21  Temp:  97.8 F (36.6 C) 97.9 F (36.6 C) 98.1 F (36.7 C)  TempSrc:  Oral Oral Oral  SpO2:  95% 99% 97%  Weight:   (!) 314 lb 3.2 oz (142.5 kg)   Height:        Intake/Output Summary (Last 24 hours) at 01/31/16 0850 Last data filed at 01/31/16 0300  Gross per 24 hour  Intake          1143.62 ml  Output             1350 ml  Net          -206.38 ml   Filed Weights   01/30/16 0047 01/30/16 0500 01/31/16 0500  Weight: (!) 318 lb 4.8 oz (144.4 kg) (!) 317 lb 11.2 oz (144.1 kg) (!) 314 lb 3.2 oz (142.5 kg)    Physical Exam   GEN: Well nourished, well developed, in no acute distress.  HEENT: Grossly normal.  Neck: Supple, no JVD, carotid bruits, or masses. Cardiac: irreg. Irregular rhythm, no murmurs, rubs, or gallops. No clubbing, cyanosis,  +1 pedal edema.  Radials/DP/PT 2+ and equal bilaterally.  Respiratory:  Respirations regular and unlabored, expiratory wheezing in upper lobes.  GI: Soft, nontender, nondistended, BS + x 4. MS: no deformity or atrophy. Skin: warm and dry, no rash. Neuro:  Strength and sensation are  intact. Psych: AAOx3.  Normal affect.  Labs    CBC  Recent Labs  01/30/16 0229 01/31/16 0707  WBC 12.6* 10.4  HGB 15.5* 15.3*  HCT 46.1* 47.2*  MCV 88.1 90.6  PLT 232 207   Basic Metabolic Panel  Recent Labs  01/29/16 1316 01/30/16 0229  NA 137 136  K 3.9 4.0  CL 102 101  CO2 21* 23  GLUCOSE 113* 128*  BUN 12 16  CREATININE 0.89 0.98  CALCIUM 9.2 8.9   Liver Function Tests  Recent Labs  01/29/16 1316  AST 35  ALT 27  ALKPHOS 62  BILITOT 1.8*  PROT 7.3  ALBUMIN 3.6   Cardiac Enzymes  Recent Labs  01/29/16 2245 01/30/16 0229 01/30/16 0921  TROPONINI 0.03* 0.04* 0.04*   Hemoglobin A1C  Recent Labs  01/30/16 0115  HGBA1C 6.0*   Fasting Lipid Panel  Recent Labs  01/30/16 0229  CHOL 107  HDL 41  LDLCALC 54  TRIG 61  CHOLHDL 2.6   Thyroid Function Tests  Recent Labs  01/29/16 2245  TSH 2.363    Telemetry  Afib, rates remain elevated in the 100's - Personally Reviewed    Radiology    Dg Chest Portable 1 View  Result Date: 01/29/2016 CLINICAL DATA:  66 year old female with shortness of breath and cough for 2 weeks. Former smoker. Initial encounter. EXAM: PORTABLE CHEST 1 VIEW COMPARISON:  None. FINDINGS: Portable AP upright view at 1249 hours. Suggestion of cardiomegaly but other mediastinal contours appear normal. Veiling opacity at both lung bases. No superimposed pneumothorax or definite pulmonary edema. No other confluent pulmonary opacity. Negative visible bowel gas pattern in the upper abdomen. IMPRESSION: 1. Veiling bibasilar pulmonary opacity is nonspecific but might relate to bilateral pleural effusions. PA and lateral views of the chest recommended when possible. 2. Suggestion of cardiomegaly.  No acute pulmonary edema. Electronically Signed   By: Odessa Fleming M.D.   On: 01/29/2016 13:06      Patient Profile     Ms. Briana Kane is a 66 year old female with no past medical history who presented to the ED on 01/29/16 with CAP and  rapid Afib  Assessment & Plan    1. Atrial fibrillation with RVR: Presents with fatigue, cough and found to be in Afib. On diltiazem gtt, Amiodarone added as her rates were elevated. They remain elevated this morning. Increase metoprolol to 50mg  BID, and plan for TEE/DCCV tomorrow (02/01/16) at 13:00   This patients CHA2DS2-VASc Score and unadjusted Ischemic Stroke Rate (% per year) is equal to 2.2 % stroke rate/year from a score of 2Above score calculated as 1 point each if present [CHF, HTN, DM, Vascular=MI/PAD/Aortic Plaque, Age if 65-74, or Female], 2 points each if present [Age >75, or Stroke/TIA/TE]  She has a CHA2DS2-VASc score of 2 for gender only, and we talked about anticoagulation but she prefers to use natural herbs, however after talking with her she is open to starting Eliquis. She has had 2 doses of Eliquis so far, tolerating well.   Her BNP is elevated and she has bilateral lower extremity edema, will get Echo. I fear that she has some degree of LV dysfunction from her Afib.   2. CAP: Per primary team.  3. Bilateral lower extremity edema: Transition to po Lasix today, 20mg  BID.    Signed, Little Ishikawa, NP  01/31/2016, 8:50 AM   Pateint seen and examined   Agree with findings as noted above by E Smith above   Pt currently a little lightheaded  BP 99/  HR 100s   Remains on IV amiodarone  ON exam, pt morbidly obese.  LUngs are rel clear   Cardiac Irreg irreg  No S3  Ext without signif edema  Plan to continue current regimen  TEE/Cardioversion tomorrow.  Begin Eliquis.  Dietrich Pates

## 2016-01-31 NOTE — Progress Notes (Signed)
Pt is having dizzy spells, HR 110-120's at rest. Attending physician and cardiology team was notified. Colleen Can, RN

## 2016-01-31 NOTE — Progress Notes (Signed)
  Echocardiogram 2D Echocardiogram has been performed.  Delcie Roch 01/31/2016, 5:06 PM

## 2016-01-31 NOTE — Care Management Note (Addendum)
Case Management Note  Patient Details  Name: Briana Kane MRN: [id number] Date of Birth: 1950/08/02  Subjective/Objective:  Pt presented for Atrial Fib RVR. CM did try to speak with pt in regards to referral and new medication. Pt states she is too exhausted to talk. Benefits check completed and co pay listed below. CM did provide pt with 30 day free and co pay card for Eliquis. Pt will benefit from PT/OT consult before d/c if pt not moving around in the room- may be deconditioned.                     Action/Plan: S/W STEPHANIE @ [facility] # [phone]   1.ELIQUIS 2.5 MG BID  COVER- YES  CO-PAY- $ 80.00  TIER- 4 DRUG  PRIOR APPROVAL- NO   2 ELIQUIS 5 MG BID  COVER- YES  CO-PAY- $ 80.00  TIER- 4 DRUG  PRIOR APPROVAL- NO   Expected Discharge Date:                  Expected Discharge Plan:  Home/Self Care  In-House Referral:  NA  Discharge planning Services  CM Consult  Post Acute Care Choice:   NA Choice offered to:   NA  DME Arranged:   NA DME Agency:   NA  HH Arranged:   NA HH Agency:   NA  Status of Service:  Completed If discussed at Long Length of Stay Meetings, dates discussed:    Additional Comments: 1110 02-01-16 Tomi Bamberger, RN,BSN [phone] CM was able to speak with pt in regards to disposition needs and medications. Pt lives alone and has a cane to use at times-PTA independent. Pt uses [facility] on [address] for medications. Pt states co pay is expensive- co pay card given to patient- hopefully co pay will go to $10.00. Pt is aware. CM did call [facility] on [address] and medications in stock. Pt will need to call her insurance provider and they can set her up with a PCP in network.  No further needs from CM at this time.  Gala Lewandowsky, RN 01/31/2016, 3:09 PM

## 2016-02-01 ENCOUNTER — Inpatient Hospital Stay (HOSPITAL_COMMUNITY): Payer: BLUE CROSS/BLUE SHIELD | Admitting: Certified Registered"

## 2016-02-01 ENCOUNTER — Encounter (HOSPITAL_COMMUNITY): Payer: Self-pay | Admitting: Certified Registered"

## 2016-02-01 ENCOUNTER — Encounter (HOSPITAL_COMMUNITY)
Admission: EM | Disposition: A | Discharge status: Home / Self Care | Payer: Self-pay | Source: Home / Self Care | Attending: Internal Medicine

## 2016-02-01 ENCOUNTER — Inpatient Hospital Stay (HOSPITAL_COMMUNITY): Payer: BLUE CROSS/BLUE SHIELD

## 2016-02-01 DIAGNOSIS — I340 Nonrheumatic mitral (valve) insufficiency: Secondary | ICD-10-CM

## 2016-02-01 DIAGNOSIS — I5021 Acute systolic (congestive) heart failure: Secondary | ICD-10-CM

## 2016-02-01 DIAGNOSIS — I5020 Unspecified systolic (congestive) heart failure: Secondary | ICD-10-CM | POA: Diagnosis present

## 2016-02-01 DIAGNOSIS — I4891 Unspecified atrial fibrillation: Secondary | ICD-10-CM

## 2016-02-01 DIAGNOSIS — R609 Edema, unspecified: Secondary | ICD-10-CM

## 2016-02-01 HISTORY — PX: CARDIOVERSION: SHX1299

## 2016-02-01 HISTORY — PX: TEE WITHOUT CARDIOVERSION: SHX5443

## 2016-02-01 LAB — CBC
HCT: 48.8 % — ABNORMAL HIGH (ref 36.0–46.0)
Hemoglobin: 16.4 g/dL — ABNORMAL HIGH (ref 12.0–15.0)
MCH: 29.7 pg (ref 26.0–34.0)
MCHC: 33.6 g/dL (ref 30.0–36.0)
MCV: 88.2 fL (ref 78.0–100.0)
PLATELETS: 248 10*3/uL (ref 150–400)
RBC: 5.53 MIL/uL — ABNORMAL HIGH (ref 3.87–5.11)
RDW: 14.2 % (ref 11.5–15.5)
WBC: 12.5 10*3/uL — AB (ref 4.0–10.5)

## 2016-02-01 SURGERY — CARDIOVERSION
Anesthesia: General

## 2016-02-01 MED ORDER — BUTAMBEN-TETRACAINE-BENZOCAINE 2-2-14 % EX AERO
INHALATION_SPRAY | CUTANEOUS | Status: DC | PRN
  Administered 2016-02-01: 2 via TOPICAL

## 2016-02-01 MED ORDER — SODIUM CHLORIDE 0.9 % IV SOLN
INTRAVENOUS | Status: DC
  Administered 2016-02-01: 11:00:00 via INTRAVENOUS

## 2016-02-01 MED ORDER — PROPOFOL 10 MG/ML IV BOLUS
INTRAVENOUS | Status: DC | PRN
  Administered 2016-02-01 (×2): 30 mg via INTRAVENOUS

## 2016-02-01 MED ORDER — LIDOCAINE 2% (20 MG/ML) 5 ML SYRINGE
INTRAMUSCULAR | Status: DC | PRN
  Administered 2016-02-01: 50 mg via INTRAVENOUS

## 2016-02-01 MED ORDER — PROPOFOL 500 MG/50ML IV EMUL
INTRAVENOUS | Status: DC | PRN
  Administered 2016-02-01: 75 ug/kg/min via INTRAVENOUS

## 2016-02-01 NOTE — Transfer of Care (Signed)
Immediate Anesthesia Transfer of Care Note  Patient: Briana Kane  Procedure(s) Performed: Procedure(s): CARDIOVERSION (N/A) TRANSESOPHAGEAL ECHOCARDIOGRAM (TEE) (N/A)  Patient Location: Endoscopy Unit  Anesthesia Type:General  Level of Consciousness: awake, oriented and patient cooperative  Airway & Oxygen Therapy: Patient Spontanous Breathing and Patient connected to face mask oxygen  Post-op Assessment: Report given to RN, Post -op Vital signs reviewed and stable and Patient moving all extremities  Post vital signs: Reviewed and stable  Last Vitals:  Vitals:   02/01/16 1205 02/01/16 1400  BP: (!) 136/99   Pulse: (!) 108 (!) 112  Resp: (!) 30 (!) 25  Temp: 36.6 C 36.3 C    Last Pain:  Vitals:   02/01/16 1400  TempSrc: Oral  PainSc:       Patients Stated Pain Goal: 0 (02/01/16 0845)  Complications: No apparent anesthesia complications

## 2016-02-01 NOTE — CV Procedure (Signed)
    Transesophageal Echocardiogram Note  Briana Kane [id number] January 11, 1951  Procedure: Transesophageal Echocardiogram Indications: atrial fib  Procedure Details Consent: Obtained Time Out: Verified patient identification, verified procedure, site/side was marked, verified correct patient position, special equipment/implants available, Radiology Safety Procedures followed,  medications/allergies/relevent history reviewed, required imaging and test results available.  Performed  Medications:  During this procedure the patient is administered Propofol drip 188 mg total for TEE and cardioversion  by anesthesia for  sedation.  The patient's heart rate, blood pressure, and oxygen saturation are monitored continuously during the procedure.  Left Ventrical:  Moderate LV dysfunction   Mitral Valve: moderate MR   Aortic Valve: normal   Tricuspid Valve: mod-severe TR   Pulmonic Valve: trace PI  Left Atrium/ Left atrial appendage: no thrombi  Atrial septum: no ASD or PFO by color flow   Aorta: not visualized    Complications:  Pt has airway compromise with the propofol and required airway placement and bagging following the procedure  Patient did tolerate procedure well.      Cardioversion Note  Briana Kane [id number] Jul 01, 1950  Procedure: DC Cardioversion Indications: atrial fib   Procedure Details Consent: Obtained Time Out: Verified patient identification, verified procedure, site/side was marked, verified correct patient position, special equipment/implants available, Radiology Safety Procedures followed,  medications/allergies/relevent history reviewed, required imaging and test results available.  Performed  The patient has been on adequate anticoagulation.  The patient received Propofol 183 mg drip during the TEE and cardioversion attempt  for sedation.  Synchronous cardioversion was performed at 200, 200, 200  joules.  The cardioversion was   Unsuccessful     Complications:  The patient was slow to wake up .  Patient did tolerate procedure well.   [name], Montez Hageman., MD, [facility] 02/01/2016, 1:36 PM

## 2016-02-01 NOTE — Progress Notes (Signed)
Patient Name: Briana Kane Date of Encounter: 02/01/2016  Primary Cardiologist: Dr. Geronimo Boot Problem List     Active Problems:   Atrial fibrillation with RVR [facility])   Community acquired pneumonia   Obesity   Bilateral leg edema   Generalized edema   SOB (shortness of breath)     Subjective   Feels well, had some anxiety last night. Denies chest pain and palpitations.   Inpatient Medications    Scheduled Meds: . apixaban  5 mg Oral BID  . azithromycin  500 mg Oral Q24H  . cefTRIAXone (ROCEPHIN)  IV  1 g Intravenous Q24H  . furosemide  20 mg Oral BID  . metoprolol tartrate  50 mg Oral BID   Continuous Infusions: . sodium chloride    . amiodarone 30 mg/hr (02/01/16 0139)   PRN Meds: acetaminophen, ALPRAZolam, metoprolol, ondansetron (ZOFRAN) IV   Vital Signs    Vitals:   01/31/16 0900 01/31/16 0911 01/31/16 1207 01/31/16 2050  BP: 106/83 106/83 108/85 106/72  Pulse: (!) 134 (!) 128 (!) 115 (!) 117  Resp: (!) 23  (!) 21 (!) 23  Temp:   98.3 F (36.8 C) (!) 96 F (35.6 C)  TempSrc:   Oral Axillary  SpO2: 97%  98% 96%  Weight:      Height:        Intake/Output Summary (Last 24 hours) at 02/01/16 0945 Last data filed at 01/31/16 1946  Gross per 24 hour  Intake              480 ml  Output              300 ml  Net              180 ml   Filed Weights   01/30/16 0047 01/30/16 0500 01/31/16 0500  Weight: (!) 318 lb 4.8 oz (144.4 kg) (!) 317 lb 11.2 oz (144.1 kg) (!) 314 lb 3.2 oz (142.5 kg)    Physical Exam   ZOX:WRUE nourished, well developed, in no acute distress.  HEENT:Grossly normal.  Neck:Supple, no JVD, carotid bruits, or masses. Cardiac:irreg. Irregular rhythm, no murmurs, rubs, or gallops. No clubbing, cyanosis, +1 pedal edema. Radials/DP/PT 2+ and equal bilaterally.  Respiratory:Respirations regular and unlabored, expiratory wheezing in upper lobes.  AV:WUJW, nontender, nondistended, BS + x 4. MS:no deformity or  atrophy. Skin:warm and dry, no rash. Neuro:Strength and sensation are intact. Psych:AAOx3. Normal affect.  Labs    CBC  Recent Labs  01/31/16 0707 02/01/16 0525  WBC 10.4 12.5*  HGB 15.3* 16.4*  HCT 47.2* 48.8*  MCV 90.6 88.2  PLT 207 248   Basic Metabolic Panel  Recent Labs  01/30/16 0229 01/31/16 0707  NA 136 138  K 4.0 3.9  CL 101 97*  CO2 23 31  GLUCOSE 128* 111*  BUN 16 18  CREATININE 0.98 0.92  CALCIUM 8.9 8.8*   Liver Function Tests  Recent Labs  01/29/16 1316  AST 35  ALT 27  ALKPHOS 62  BILITOT 1.8*  PROT 7.3  ALBUMIN 3.6  Cardiac Enzymes  Recent Labs  01/29/16 2245 01/30/16 0229 01/30/16 0921  TROPONINI 0.03* 0.04* 0.04*   Hemoglobin A1C  Recent Labs  01/30/16 0115  HGBA1C 6.0*   Fasting Lipid Panel  Recent Labs  01/30/16 0229  CHOL 107  HDL 41  LDLCALC 54  TRIG 61  CHOLHDL 2.6   Thyroid Function Tests  Recent Labs  01/29/16 2245  TSH 2.363  Telemetry     Afib, elevated rates in the 100's - Personally Reviewed   Radiology    No results found.  Cardiac Studies   Transthoracic Echocardiography Study Conclusions  - Left ventricle: The cavity size was normal. Wall thickness was   normal. Systolic function was severely reduced. The estimated   ejection fraction was in the range of 25% to 30%. Diffuse   hypokinesis. - Ventricular septum: The contour showed diastolic flattening. - Mitral valve: There was moderate regurgitation directed   posteriorly. - Left atrium: The atrium was moderately dilated. - Right ventricle: The cavity size was mildly dilated. Wall   thickness was normal. Systolic function was severely reduced. - Right atrium: The atrium was moderately dilated. - Tricuspid valve: There was moderate regurgitation.   Patient Profile     Briana Kane is a 66 year old female with no past medical history who presented to the ED on 01/29/16 with CAP and rapid Afib  Assessment & Plan  1.  Atrial fibrillation with RVR: Presents with fatigue, cough and found to be in Afib. On diltiazem gtt, Amiodarone added as her rates were elevated. They remain elevated this morning. Increase metoprolol to 50mg  BID, and plan for TEE/DCCV today (02/01/16) at 13:00   This patients CHA2DS2-VASc Score and unadjusted Ischemic Stroke Rate (% per year) is equal to 2.2 % stroke rate/year from a score of 2Above score calculated as 1 point each if present [CHF, HTN, DM, Vascular=MI/PAD/Aortic Plaque, Age if 65-74, or Female], 2 points each if present [Age >75, or Stroke/TIA/TE]  She has a CHA2DS2-VASc score of 2 for gender only, and we talked about anticoagulation but she prefers to use natural herbs, however after talking with her she is open to starting Eliquis. She has had 4 doses of Eliquis so far, tolerating well.   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   2. CAP: Per primary team.  3. Bilateral lower extremity edema: Transition to po Lasix today, 20mg  BID.    Signed, Briana Ishikawa, NP  02/01/2016, 9:45 AM   I have personally seen and examined this patient with Briana Righter, NP. I agree with the assessment and plan as outlined above. She is still in atrial fib. Rate 110-120s. Plan TEE DCCV today. Will change to po amiodarone following DCCV. 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 Will reassess LVEF in one month following DCCV. Will not plan ischemic evaluation at this time. Continue Eliquis.   Briana Kane 02/01/2016 10:16 AM

## 2016-02-01 NOTE — Progress Notes (Signed)
Progress Note    [name]  SKA:[id number] DOB: [birth year]  DOA: 01/29/2016 PCP: Pcp Not In System    Brief Narrative:   Chief complaint: Follow-up atrial fibrillation  Briana Kane is an 66 y.o. female Without significant past medical history, does not follow with any PCP, he was complaints of cough and shortness of breath, workup significant for new onset atrial fibrillation, CAP, and volume overload.  Assessment/Plan:   Principal problems:  A fib with RVR/demand ischemia/elevated troponins Heart rate continues to be uncontrolled with rates up into the 130s despite therapy. Currently being managed with an amiodarone drip and metoprolol 50 mg twice a day with 5 mg as needed every 5 minutes for heart rate greater than 110. Diltiazem was not particularly effective so this was discontinued. She was initially treated with heparin, but is now on Eliquis. Mild elevation of troponins are likely related to demand ischemia in the setting of significant tachycardia. Trend is flat. Attempted DCCV done today, but it was not successful.  Volume overload/Acute systolic CHF Presents with bilateral lower extremity edema, and elevated BNP, and an EF of 25-30 percent with diffuse hypokinesis by 2-D echocardiogram done 01/31/16. Continue to diuresis needed.   Anxiety Had an anxiety attack yesterday with sustained HR in the 130's.  Xanax helped.  CAP Empirically treated with Rocephin/azithromycin. Chest x-ray reviewed and is as pictured below. Findings appear to be more consistent with CHF, with probable bilateral pleural effusions. Could consider repeat chest radiography with lateral films or just follow clinically. Respiratory virus panel was negative.    Family Communication/Anticipated D/C date and plan/Code Status   DVT prophylaxis: Eliquis ordered. Code Status: Full Code.  Family Communication: No family currently at the bedside. Disposition Plan: Home when HR controlled,  still uncontrolled.   Medical Consultants:    Cardiology   Procedures:    DCCV 02/01/16: Not successful.  Anti-Infectives:    Rocephin 01/30/16--->  Azithromycin 01/30/16 --->  Subjective:   The patient reports that she had a "panic attack" yesterday, but that her symptoms improved with xanax.  Has episodes of dyspnea.  Cough improved.  Objective:    Vitals:   01/31/16 0900 01/31/16 0911 01/31/16 1207 01/31/16 2050  BP: 106/83 106/83 108/85 106/72  Pulse: (!) 134 (!) 128 (!) 115 (!) 117  Resp: (!) 23  (!) 21 (!) 23  Temp:   98.3 F (36.8 C) (!) 96 F (35.6 C)  TempSrc:   Oral Axillary  SpO2: 97%  98% 96%  Weight:      Height:        Intake/Output Summary (Last 24 hours) at 02/01/16 0848 Last data filed at 01/31/16 1946  Gross per 24 hour  Intake              720 ml  Output              300 ml  Net              420 ml   Filed Weights   01/30/16 0047 01/30/16 0500 01/31/16 0500  Weight: (!) 144.4 kg (318 lb 4.8 oz) (!) 144.1 kg (317 lb 11.2 oz) (!) 142.5 kg (314 lb 3.2 oz)    Exam: General exam: Appears Anxious with breathlessness at times. Respiratory system: Clear to auscultation. Respiratory effort mildly increased. Cardiovascular system: Heart sounds irregularly irregular/tachycardic. No JVD,  rubs, gallops or clicks. No murmurs. Gastrointestinal system: Abdomen is nondistended, soft and nontender. No organomegaly or  masses felt. Normal bowel sounds heard. Central nervous system: Alert and oriented. No focal neurological deficits. Extremities: No clubbing,  or cyanosis. No edema. Skin: No rashes, lesions or ulcers. Psychiatry: Judgement and insight appear normal. Mood & affect appropriate.   Data Reviewed:   I have personally reviewed following labs and imaging studies:  Labs: Basic Metabolic Panel:  Recent Labs Lab 01/29/16 1316 01/30/16 0229 01/31/16 0707  NA 137 136 138  K 3.9 4.0 3.9  CL 102 101 97*  CO2 21* 23 31  GLUCOSE 113* 128*  111*  BUN 12 16 18   CREATININE 0.89 0.98 0.92  CALCIUM 9.2 8.9 8.8*   GFR Estimated Creatinine Clearance: 92.9 mL/min (by C-G formula based on SCr of 0.92 mg/dL). Liver Function Tests:  Recent Labs Lab 01/29/16 1316  AST 35  ALT 27  ALKPHOS 62  BILITOT 1.8*  PROT 7.3  ALBUMIN 3.6   No results for input(s): LIPASE, AMYLASE in the last 168 hours. No results for input(s): AMMONIA in the last 168 hours. Coagulation profile  Recent Labs Lab 01/29/16 1316  INR 1.42    CBC:  Recent Labs Lab 01/29/16 1316 01/30/16 0229 01/31/16 0707 02/01/16 0525  WBC 11.8* 12.6* 10.4 12.5*  HGB 16.3* 15.5* 15.3* 16.4*  HCT 48.1* 46.1* 47.2* 48.8*  MCV 89.1 88.1 90.6 88.2  PLT 243 232 207 248   Cardiac Enzymes:  Recent Labs Lab 01/29/16 2245 01/30/16 0229 01/30/16 0921  TROPONINI 0.03* 0.04* 0.04*   BNP (last 3 results) No results for input(s): PROBNP in the last 8760 hours. CBG: No results for input(s): GLUCAP in the last 168 hours. D-Dimer: No results for input(s): DDIMER in the last 72 hours. Hgb A1c:  Recent Labs  01/30/16 0115  HGBA1C 6.0*   Lipid Profile:  Recent Labs  01/30/16 0229  CHOL 107  HDL 41  LDLCALC 54  TRIG 61  CHOLHDL 2.6   Thyroid function studies:  Recent Labs  01/29/16 2245  TSH 2.363   Anemia work up: No results for input(s): VITAMINB12, FOLATE, FERRITIN, TIBC, IRON, RETICCTPCT in the last 72 hours. Sepsis Labs:  Recent Labs Lab 01/29/16 1316 01/30/16 0229 01/31/16 0707 02/01/16 0525  WBC 11.8* 12.6* 10.4 12.5*    Microbiology Recent Results (from the past 240 hour(s))  Respiratory Panel by PCR     Status: None   Collection Time: 01/29/16 11:42 PM  Result Value Ref Range Status   Adenovirus NOT DETECTED NOT DETECTED Final   Coronavirus 229E NOT DETECTED NOT DETECTED Final   Coronavirus HKU1 NOT DETECTED NOT DETECTED Final   Coronavirus NL63 NOT DETECTED NOT DETECTED Final   Coronavirus OC43 NOT DETECTED NOT DETECTED  Final   Metapneumovirus NOT DETECTED NOT DETECTED Final   Rhinovirus / Enterovirus NOT DETECTED NOT DETECTED Final   Influenza A NOT DETECTED NOT DETECTED Final   Influenza B NOT DETECTED NOT DETECTED Final   Parainfluenza Virus 1 NOT DETECTED NOT DETECTED Final   Parainfluenza Virus 2 NOT DETECTED NOT DETECTED Final   Parainfluenza Virus 3 NOT DETECTED NOT DETECTED Final   Parainfluenza Virus 4 NOT DETECTED NOT DETECTED Final   Respiratory Syncytial Virus NOT DETECTED NOT DETECTED Final   Bordetella pertussis NOT DETECTED NOT DETECTED Final   Chlamydophila pneumoniae NOT DETECTED NOT DETECTED Final   Mycoplasma pneumoniae NOT DETECTED NOT DETECTED Final  MRSA PCR Screening     Status: None   Collection Time: 01/30/16  1:07 AM  Result Value Ref Range Status  MRSA by PCR NEGATIVE NEGATIVE Final    Comment:        The GeneXpert MRSA Assay (FDA approved for NASAL specimens only), is one component of a comprehensive MRSA colonization surveillance program. It is not intended to diagnose MRSA infection nor to guide or monitor treatment for MRSA infections.     Radiology: No results found.  Medications:   . apixaban  5 mg Oral BID  . azithromycin  500 mg Oral Q24H  . cefTRIAXone (ROCEPHIN)  IV  1 g Intravenous Q24H  . furosemide  20 mg Oral BID  . metoprolol tartrate  50 mg Oral BID   Continuous Infusions: . amiodarone 30 mg/hr (02/01/16 0139)    Medical decision making is of high complexity and this patient is at high risk of deterioration, therefore this is a level 3 visit.  (> 4 problem points, 2 data points, high risk)   Problems/DDx Points   Self limited or minor (max 2)       1   Established problem, stable       1   Established problem, worsening       2   New problem, no additional W/U planned (max 1)       3   New problem, additional W/U planned        4    Data Reviewed Points   Review/order clinical lab tests       1   Review/order x-rays       1     Review/order tests (Echo, EKG, PFTs, etc)       1   Discussion of test results w/ performing MD       1   Independent review of image, tracing or specimen       2   Decision to obtain old records       1   Review and summation of old records       2    Level of risk Presenting prob Diagnostics Management   Minimal 1 self limited/minor Labs CXR EKG/EEG U/A U/S Rest Gargles Bandages Dressings   Low 2 or more self limited/minor 1 stable chronic Acute uncomplicated illness Tests (PFTS) Non-CV imaging Arterial labs Biopsies of skin OTC drugs Minor surgery-no risk PT OT IVF without additives    Moderate 1 or more chronic illnesses w/ mild exac, progression or S/E from tx 2 or more stable chronic illnesses Undiagnosed new problem w/ uncertain prognosis Acute complicated injury  Stress tests Endoscopies with no risk factors Deep needle or incisional bx CV imaging without risk LP Thoracentesis Paracentesis Minor surgery w/ risks Elective major surgery w/ no risk (open, percutaneous or endoscopic) Prescription drugs Therapeutic nucl med IVF with additives Closed tx of fracture/dislocation    High Severe exac of chronic illness Acute or chronic illness/injury may pose a threat to life or bodily function (ARF) Change in neuro status    CV imaging w/ contrast and risk Cardio electophysiologic tests Endoscopies w/ risk Discography Elective major surgery Emergency major surgery Parenteral controlled substances Drug therapy req monitoring for toxicity DNR/de-escalation of care    MDM Prob points Data points Risk   Straightforward    <1    <1    Min   Low complexity    2    2    Low   Moderate    3    3    Mod   High Complexity    4 or more  4 or more    High      LOS: 2 days   Jadalynn Burr  [facility] Pager [phone]. If unable to reach me by pager, please call my cell phone at [phone].  *Please refer to [email or web address], password [email or web address] to get updated  schedule on who will round on this patient, as hospitalists switch teams weekly. If 7PM-7AM, please contact night-coverage at [email or web address], password [email or web address] for any overnight needs.  02/01/2016, 8:48 AM

## 2016-02-01 NOTE — Anesthesia Preprocedure Evaluation (Addendum)
Anesthesia Evaluation  Patient identified by MRN, date of birth, ID band Patient awake    Reviewed: Allergy & Precautions, NPO status , Patient's Chart, lab work & pertinent test results  Airway Mallampati: III  TM Distance: >3 FB Neck ROM: Full    Dental  (+) Dental Advisory Given   Pulmonary pneumonia, former smoker,    breath sounds clear to auscultation       Cardiovascular + dysrhythmias Atrial Fibrillation  Rhythm:Regular Rate:Normal  EF 25-30% in setting of afib with rvr and pneumonia   Neuro/Psych negative neurological ROS     GI/Hepatic negative GI ROS, Neg liver ROS,   Endo/Other  negative endocrine ROS  Renal/GU negative Renal ROS     Musculoskeletal   Abdominal   Peds  Hematology negative hematology ROS (+)   Anesthesia Other Findings   Reproductive/Obstetrics                            Anesthesia Physical Anesthesia Plan  ASA: III  Anesthesia Plan: General   Post-op Pain Management:    Induction: Intravenous  Airway Management Planned: Mask and Natural Airway  Additional Equipment:   Intra-op Plan:   Post-operative Plan:   Informed Consent: I have reviewed the patients History and Physical, chart, labs and discussed the procedure including the risks, benefits and alternatives for the proposed anesthesia with the patient or authorized representative who has indicated his/her understanding and acceptance.     Plan Discussed with:   Anesthesia Plan Comments:         Anesthesia Quick Evaluation

## 2016-02-01 NOTE — Anesthesia Postprocedure Evaluation (Signed)
Anesthesia Post Note  Patient: Briana Kane  Procedure(s) Performed: Procedure(s) (LRB): CARDIOVERSION (N/A) TRANSESOPHAGEAL ECHOCARDIOGRAM (TEE) (N/A)  Patient location during evaluation: PACU Anesthesia Type: General Level of consciousness: awake and alert Pain management: pain level controlled Vital Signs Assessment: post-procedure vital signs reviewed and stable Respiratory status: spontaneous breathing, nonlabored ventilation, respiratory function stable and patient connected to nasal cannula oxygen Cardiovascular status: blood pressure returned to baseline and stable Postop Assessment: no signs of nausea or vomiting Anesthetic complications: no       Last Vitals:  Vitals:   02/01/16 1410 02/01/16 1415  BP: (!) 152/114 (!) 142/97  Pulse: (!) 106 (!) 111  Resp: 20 (!) 24  Temp:      Last Pain:  Vitals:   02/01/16 1415  TempSrc:   PainSc: 0-No pain                 Kennieth Rad

## 2016-02-01 NOTE — Progress Notes (Signed)
  Echocardiogram Echocardiogram Transesophageal has been performed.  Briana Kane 02/01/2016, 2:34 PM

## 2016-02-02 ENCOUNTER — Encounter (HOSPITAL_COMMUNITY): Payer: Self-pay | Admitting: Cardiovascular Disease

## 2016-02-02 DIAGNOSIS — Z6841 Body Mass Index (BMI) 40.0 and over, adult: Secondary | ICD-10-CM

## 2016-02-02 DIAGNOSIS — I5023 Acute on chronic systolic (congestive) heart failure: Secondary | ICD-10-CM

## 2016-02-02 DIAGNOSIS — I428 Other cardiomyopathies: Secondary | ICD-10-CM

## 2016-02-02 DIAGNOSIS — I5021 Acute systolic (congestive) heart failure: Secondary | ICD-10-CM | POA: Diagnosis present

## 2016-02-02 HISTORY — DX: Morbid (severe) obesity due to excess calories: E66.01

## 2016-02-02 HISTORY — DX: Body Mass Index (BMI) 40.0 and over, adult: Z684

## 2016-02-02 HISTORY — DX: Acute systolic (congestive) heart failure: I50.21

## 2016-02-02 LAB — CBC
HCT: 46.4 % — ABNORMAL HIGH (ref 36.0–46.0)
Hemoglobin: 15.5 g/dL — ABNORMAL HIGH (ref 12.0–15.0)
MCH: 29.6 pg (ref 26.0–34.0)
MCHC: 33.4 g/dL (ref 30.0–36.0)
MCV: 88.7 fL (ref 78.0–100.0)
PLATELETS: 190 10*3/uL (ref 150–400)
RBC: 5.23 MIL/uL — ABNORMAL HIGH (ref 3.87–5.11)
RDW: 14.2 % (ref 11.5–15.5)
WBC: 8.4 10*3/uL (ref 4.0–10.5)

## 2016-02-02 MED ORDER — AMIODARONE HCL 200 MG PO TABS
200.0000 mg | ORAL_TABLET | Freq: Two times a day (BID) | ORAL | Status: DC
  Administered 2016-02-02 – 2016-02-04 (×5): 200 mg via ORAL
  Filled 2016-02-02 (×5): qty 1

## 2016-02-02 NOTE — Progress Notes (Signed)
Progress Note    Briana Kane  MGQ:[id number] DOB: Jan 27, 1950  DOA: 01/29/2016 PCP: Pcp Not In System    Brief Narrative:   Chief complaint: Follow-up atrial fibrillation  Briana Kane is an 66 y.o. female Without significant past medical history, does not follow with any PCP, he was complaints of cough and shortness of breath, workup significant for new onset atrial fibrillation, CAP, and volume overload.  Assessment/Plan:   Principal problems:  A fib with RVR/demand ischemia/elevated troponins Heart rate continues to be uncontrolled with rates up into the 130s despite therapy. Currently being managed with an amiodarone drip and metoprolol 50 mg twice a day with 5 mg as needed every 5 minutes for heart rate greater than 110. Diltiazem was not particularly effective so this was discontinued. She was initially treated with heparin, but is now on Eliquis. Mild elevation of troponins are likely related to demand ischemia in the setting of significant tachycardia. Trend is flat. Attempted DCCV 02/01/16, but it was not successful. Rate controlling medications being adjusted by cardiologist.  Volume overload/Acute systolic CHF Presents with bilateral lower extremity edema, and elevated BNP, and an EF of 25-30 percent with diffuse hypokinesis by 2-D echocardiogram done 01/31/16. Continue to diuresis needed.   Active problems:  Morbid obesity with a BMI of 49.3 Body mass index is 49.3 kg/m. Counseled on weight loss.  Anxiety Xanax ordered as needed.  CAP Empirically treated with Rocephin/azithromycin. Chest x-ray reviewed and is as pictured below. Findings appear to be more consistent with CHF, with probable bilateral pleural effusions. Could consider repeat chest radiography with lateral films or just follow clinically. Respiratory virus panel was negative.    Family Communication/Anticipated D/C date and plan/Code Status   DVT prophylaxis: Eliquis ordered. Code Status:  Full Code.  Family Communication: No family currently at the bedside. Disposition Plan: Home when HR controlled, still uncontrolled.   Medical Consultants:    Cardiology   Procedures:    DCCV 02/01/16: Not successful.  Anti-Infectives:    Rocephin 01/30/16--->  Azithromycin 01/30/16 --->  Subjective:   The patient reports that she feels lightheaded and does not feel safe enough to discharge home today. Denies chest pains or subjective palpitations, but does get occasional short of breath associated with anxiety. She says she is trying to meditate when these episodes come on.  Objective:    Vitals:   02/01/16 1415 02/01/16 2129 02/02/16 0523 02/02/16 0805  BP: (!) 142/97 94/71 108/77   Pulse: (!) 111 (!) 125 88   Resp: (!) 24 19 20    Temp:  98.5 F (36.9 C) 98.6 F (37 C)   TempSrc:  Oral Oral   SpO2: 97% 96% 96%   Weight:    (!) 142.8 kg (314 lb 12.8 oz)  Height:        Intake/Output Summary (Last 24 hours) at 02/02/16 0836 Last data filed at 02/02/16 0806  Gross per 24 hour  Intake              100 ml  Output              700 ml  Net             -600 ml   Filed Weights   01/30/16 0500 01/31/16 0500 02/02/16 0805  Weight: (!) 144.1 kg (317 lb 11.2 oz) (!) 142.5 kg (314 lb 3.2 oz) (!) 142.8 kg (314 lb 12.8 oz)    Exam: General exam: No acute distress.  Respiratory system: Clear to auscultation. Respiratory effort unlabored. Cardiovascular system: Heart sounds irregularly irregular/tachycardic. No JVD,  rubs, gallops or clicks. No murmurs. Gastrointestinal system: Abdomen is nondistended, soft and nontender. No organomegaly or masses felt. Normal bowel sounds heard. Central nervous system: Alert and oriented. No focal neurological deficits. Extremities: No clubbing,  or cyanosis. No edema. Skin: No rashes, lesions or ulcers. Psychiatry: Judgement and insight appear normal. Mood & affect appropriate.   Data Reviewed:   I have personally reviewed following  labs and imaging studies:  Labs: Basic Metabolic Panel:  Recent Labs Lab 01/29/16 1316 01/30/16 0229 01/31/16 0707  NA 137 136 138  K 3.9 4.0 3.9  CL 102 101 97*  CO2 21* 23 31  GLUCOSE 113* 128* 111*  BUN 12 16 18   CREATININE 0.89 0.98 0.92  CALCIUM 9.2 8.9 8.8*   GFR Estimated Creatinine Clearance: 93 mL/min (by C-G formula based on SCr of 0.92 mg/dL). Liver Function Tests:  Recent Labs Lab 01/29/16 1316  AST 35  ALT 27  ALKPHOS 62  BILITOT 1.8*  PROT 7.3  ALBUMIN 3.6   Coagulation profile  Recent Labs Lab 01/29/16 1316  INR 1.42    CBC:  Recent Labs Lab 01/29/16 1316 01/30/16 0229 01/31/16 0707 02/01/16 0525 02/02/16 0620  WBC 11.8* 12.6* 10.4 12.5* 8.4  HGB 16.3* 15.5* 15.3* 16.4* 15.5*  HCT 48.1* 46.1* 47.2* 48.8* 46.4*  MCV 89.1 88.1 90.6 88.2 88.7  PLT 243 232 207 248 190   Cardiac Enzymes:  Recent Labs Lab 01/29/16 2245 01/30/16 0229 01/30/16 0921  TROPONINI 0.03* 0.04* 0.04*    Microbiology Recent Results (from the past 240 hour(s))  Respiratory Panel by PCR     Status: None   Collection Time: 01/29/16 11:42 PM  Result Value Ref Range Status   Adenovirus NOT DETECTED NOT DETECTED Final   Coronavirus 229E NOT DETECTED NOT DETECTED Final   Coronavirus HKU1 NOT DETECTED NOT DETECTED Final   Coronavirus NL63 NOT DETECTED NOT DETECTED Final   Coronavirus OC43 NOT DETECTED NOT DETECTED Final   Metapneumovirus NOT DETECTED NOT DETECTED Final   Rhinovirus / Enterovirus NOT DETECTED NOT DETECTED Final   Influenza A NOT DETECTED NOT DETECTED Final   Influenza B NOT DETECTED NOT DETECTED Final   Parainfluenza Virus 1 NOT DETECTED NOT DETECTED Final   Parainfluenza Virus 2 NOT DETECTED NOT DETECTED Final   Parainfluenza Virus 3 NOT DETECTED NOT DETECTED Final   Parainfluenza Virus 4 NOT DETECTED NOT DETECTED Final   Respiratory Syncytial Virus NOT DETECTED NOT DETECTED Final   Bordetella pertussis NOT DETECTED NOT DETECTED Final    Chlamydophila pneumoniae NOT DETECTED NOT DETECTED Final   Mycoplasma pneumoniae NOT DETECTED NOT DETECTED Final  MRSA PCR Screening     Status: None   Collection Time: 01/30/16  1:07 AM  Result Value Ref Range Status   MRSA by PCR NEGATIVE NEGATIVE Final    Comment:        The GeneXpert MRSA Assay (FDA approved for NASAL specimens only), is one component of a comprehensive MRSA colonization surveillance program. It is not intended to diagnose MRSA infection nor to guide or monitor treatment for MRSA infections.     Radiology: No results found.  Medications:   . apixaban  5 mg Oral BID  . azithromycin  500 mg Oral Q24H  . cefTRIAXone (ROCEPHIN)  IV  1 g Intravenous Q24H  . furosemide  20 mg Oral BID  . metoprolol tartrate  50 mg  Oral BID   Continuous Infusions: . amiodarone 30 mg/hr (02/02/16 0009)    Medical decision making is of high complexity and this patient is at high risk of deterioration, therefore this is a level 3 visit.  (> 4 problem points, 2 data points, high risk)   Problems/DDx Points   Self limited or minor (max 2)       1   Established problem, stable       1   Established problem, worsening       2   New problem, no additional W/U planned (max 1)       3   New problem, additional W/U planned        4    Data Reviewed Points   Review/order clinical lab tests       1   Review/order x-rays       1   Review/order tests (Echo, EKG, PFTs, etc)       1   Discussion of test results w/ performing MD       1   Independent review of image, tracing or specimen       2   Decision to obtain old records       1   Review and summation of old records       2    Level of risk Presenting prob Diagnostics Management   Minimal 1 self limited/minor Labs CXR EKG/EEG U/A U/S Rest Gargles Bandages Dressings   Low 2 or more self limited/minor 1 stable chronic Acute uncomplicated illness Tests (PFTS) Non-CV imaging Arterial labs Biopsies of skin OTC  drugs Minor surgery-no risk PT OT IVF without additives    Moderate 1 or more chronic illnesses w/ mild exac, progression or S/E from tx 2 or more stable chronic illnesses Undiagnosed new problem w/ uncertain prognosis Acute complicated injury  Stress tests Endoscopies with no risk factors Deep needle or incisional bx CV imaging without risk LP Thoracentesis Paracentesis Minor surgery w/ risks Elective major surgery w/ no risk (open, percutaneous or endoscopic) Prescription drugs Therapeutic nucl med IVF with additives Closed tx of fracture/dislocation    High Severe exac of chronic illness Acute or chronic illness/injury may pose a threat to life or bodily function (ARF) Change in neuro status    CV imaging w/ contrast and risk Cardio electophysiologic tests Endoscopies w/ risk Discography Elective major surgery Emergency major surgery Parenteral controlled substances Drug therapy req monitoring for toxicity DNR/de-escalation of care    MDM Prob points Data points Risk   Straightforward    <1    <1    Min   Low complexity    2    2    Low   Moderate    3    3    Mod   High Complexity    4 or more    4 or more    High      LOS: 3 days   RAMA,CHRISTINA  [facility] Pager [phone]. If unable to reach me by pager, please call my cell phone at [phone].  *Please refer to [email or web address], password [email or web address] to get updated schedule on who will round on this patient, as hospitalists switch teams weekly. If 7PM-7AM, please contact night-coverage at [email or web address], password [email or web address] for any overnight needs.  02/02/2016, 8:36 AM

## 2016-02-02 NOTE — Progress Notes (Signed)
Patient Name: Briana Kane Date of Encounter: 02/02/2016  Primary Cardiologist: Dr. Geronimo Boot Problem List     Active Problems:   Atrial fibrillation with RVR [place] [facility])   Community acquired pneumonia   Bilateral leg edema   Generalized edema   SOB (shortness of breath)   Acute systolic CHF (congestive heart failure) (HCC)   Morbid obesity with BMI of 45.0-49.9, adult (HCC)     Subjective   Feels well, "tired" this am.  Denies chest pain and palpitations.   Inpatient Medications    Scheduled Meds: . apixaban  5 mg Oral BID  . azithromycin  500 mg Oral Q24H  . cefTRIAXone (ROCEPHIN)  IV  1 g Intravenous Q24H  . furosemide  20 mg Oral BID  . metoprolol tartrate  50 mg Oral BID   Continuous Infusions: . amiodarone 30 mg/hr (02/02/16 0009)   PRN Meds: acetaminophen, ALPRAZolam, metoprolol, ondansetron (ZOFRAN) IV   Vital Signs    Vitals:   02/02/16 0523 02/02/16 0805 02/02/16 0900 02/02/16 1018  BP: 108/77   (!) 110/97  Pulse: 88  (!) 135 (!) 118  Resp: 20  (!) 27   Temp: 98.6 F (37 C)     TempSrc: Oral     SpO2: 96%  95%   Weight:  (!) 314 lb 12.8 oz (142.8 kg)    Height:        Intake/Output Summary (Last 24 hours) at 02/02/16 1019 Last data filed at 02/02/16 0900  Gross per 24 hour  Intake              784 ml  Output              600 ml  Net              184 ml   Filed Weights   01/30/16 0500 01/31/16 0500 02/02/16 0805  Weight: (!) 317 lb 11.2 oz (144.1 kg) (!) 314 lb 3.2 oz (142.5 kg) (!) 314 lb 12.8 oz (142.8 kg)    Physical Exam   ZJI:RCVEL female in no acute distress.  HEENT:Grossly normal.  Neck:Supple, no JVD, carotid bruits, or masses. Cardiac:irreg. Irregular rhythm, no murmurs, rubs, or gallops. No clubbing, cyanosis, +1 pedal edema. Radials/DP/PT 2+ and equal bilaterally.  Respiratory:Respirations regular and unlabored, expiratory wheezing in upper lobes.  MS:no deformity or atrophy. Skin:warm and dry, no  rash. Neuro:Strength and sensation are intact. Psych:AAOx3. Normal affect.  Labs    CBC  Recent Labs  02/01/16 0525 02/02/16 0620  WBC 12.5* 8.4  HGB 16.4* 15.5*  HCT 48.8* 46.4*  MCV 88.2 88.7  PLT 248 190   Basic Metabolic Panel  Recent Labs  01/31/16 0707  NA 138  K 3.9  CL 97*  CO2 31  GLUCOSE 111*  BUN 18  CREATININE 0.92  CALCIUM 8.8*     Telemetry     Afib, elevated rates in the low 100's - Personally Reviewed   Radiology    [facility] 01/29/16 PORTABLE CHEST 1 VIEW  COMPARISON:  None.  FINDINGS: Portable AP upright view at 1249 hours. Suggestion of cardiomegaly but other mediastinal contours appear normal. Veiling opacity at both lung bases. No superimposed pneumothorax or definite pulmonary edema. No other confluent pulmonary opacity. Negative visible bowel gas pattern in the upper abdomen.  IMPRESSION: 1. Veiling bibasilar pulmonary opacity is nonspecific but might relate to bilateral pleural effusions. PA and lateral views of the chest recommended when possible. 2. Suggestion of cardiomegaly.  No acute pulmonary edema.    Cardiac Studies   Transthoracic Echocardiography Study Conclusions  - Left ventricle: The cavity size was normal. Wall thickness was   normal. Systolic function was severely reduced. The estimated   ejection fraction was in the range of 25% to 30%. Diffuse   hypokinesis. - Ventricular septum: The contour showed diastolic flattening. - Mitral valve: There was moderate regurgitation directed   posteriorly. - Left atrium: The atrium was moderately dilated. - Right ventricle: The cavity size was mildly dilated. Wall   thickness was normal. Systolic function was severely reduced. - Right atrium: The atrium was moderately dilated. - Tricuspid valve: There was moderate regurgitation.   Patient Profile     Briana Kane is an obese 66 year old female with no past medical history who presented to the ED on  01/29/16 with suspected CAP and rapid Afib.   Assessment & Plan   1. Atrial fibrillation with RVR: Presents with fatigue, cough and found to be in Afib. Amiodarone added. TEE/DCCV 02/01/16- but back in AF this am  This patients CHA2DS2-VASc Score and unadjusted Ischemic Stroke Rate (% per year) is equal to 2.2 % stroke rate/year from a score of 2Above score calculated as 1 point each if present [CHF, HTN, DM, Vascular=MI/PAD/Aortic Plaque, Age if 65-74, or Female], 2 points each if present [Age >75, or Stroke/TIA/TE]  She has a CHA2DS2-VASc score of 2 for gender only, and we talked about anticoagulation but she prefers to use natural herbs, however after talking with her she is open to starting Eliquis. She has had 4 doses of Eliquis so far, tolerating well.   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   2. CAP: Per primary team. I spoke to attending- the diagnosis of CAP was a "soft" call, I doubt persistent CAP is what caused her to go back into AF  3. Bilateral lower extremity edema: Transition to po Lasix today, 20mg  BID.   4.- Super Obesity- BMI-50. I suspect sleep apnea, nocturnal hypoxemia may be contributing.  Plan: Change to PO Amiodarone-200 mg BID. Continue Lopressor. Consider another attempt at cardioversion in a few weeks. MD to see.   Signed, Corine Shelter, PA-C  02/02/2016, 10:19 AM   I have personally seen and examined this patient with Corine Shelter, PA-C. I agree with the assessment and plan as outlined above. She was cardioverted to sinus yesterday but now back in atrial fib this am with controlled rates on amiodarone and metoprolol. At this time, I would plan for rate control strategy with Amio/Metoprolol and anti-coagulation with Eliquis. Will change amiodarone to po and will increase metoprolol to 75 mg po BID. Will reconsider DCCV in several weeks. In regards to her cardiomyopathy, it is likely tachycardia mediated. I am hopeful that she will  have some improvement in LVEF with rate control.   She can be discharged today from a cardiac standpoint. I would discharge her on amiodarone 200 mg po BID, Lopressor 75 mg po BID, Lasix 40 mg daily, Eliquis 5 mg po BID.   Verne Carrow 02/02/2016 10:45 AM

## 2016-02-03 ENCOUNTER — Inpatient Hospital Stay (HOSPITAL_COMMUNITY): Payer: BLUE CROSS/BLUE SHIELD

## 2016-02-03 DIAGNOSIS — R0600 Dyspnea, unspecified: Secondary | ICD-10-CM

## 2016-02-03 LAB — BASIC METABOLIC PANEL
ANION GAP: 10 (ref 5–15)
BUN: 23 mg/dL — ABNORMAL HIGH (ref 6–20)
CHLORIDE: 96 mmol/L — AB (ref 101–111)
CO2: 32 mmol/L (ref 22–32)
Calcium: 8.2 mg/dL — ABNORMAL LOW (ref 8.9–10.3)
Creatinine, Ser: 0.96 mg/dL (ref 0.44–1.00)
GFR calc non Af Amer: >60 mL/min (ref >60)
Glucose, Bld: 109 mg/dL — ABNORMAL HIGH (ref 65–99)
POTASSIUM: 3.9 mmol/L (ref 3.5–5.1)
SODIUM: 138 mmol/L (ref 135–145)

## 2016-02-03 LAB — CBC
HCT: 47.9 % — ABNORMAL HIGH (ref 36.0–46.0)
HEMOGLOBIN: 15.5 g/dL — AB (ref 12.0–15.0)
MCH: 29.5 pg (ref 26.0–34.0)
MCHC: 32.4 g/dL (ref 30.0–36.0)
MCV: 91.2 fL (ref 78.0–100.0)
Platelets: 196 10*3/uL (ref 150–400)
RBC: 5.25 MIL/uL — AB (ref 3.87–5.11)
RDW: 14.5 % (ref 11.5–15.5)
WBC: 6.8 10*3/uL (ref 4.0–10.5)

## 2016-02-03 MED ORDER — FUROSEMIDE 40 MG PO TABS: 40.0000 mg | ORAL_TABLET | Freq: Two times a day (BID) | ORAL | Status: DC

## 2016-02-03 MED ORDER — LISINOPRIL 5 MG PO TABS
5.0000 mg | ORAL_TABLET | Freq: Every day | ORAL | Status: DC
  Administered 2016-02-03 – 2016-02-04 (×2): 5 mg via ORAL
  Filled 2016-02-03 (×2): qty 1

## 2016-02-03 MED ORDER — FUROSEMIDE 40 MG PO TABS
40.0000 mg | ORAL_TABLET | Freq: Two times a day (BID) | ORAL | Status: DC
  Administered 2016-02-03 – 2016-02-04 (×3): 40 mg via ORAL
  Filled 2016-02-03 (×3): qty 1

## 2016-02-03 NOTE — Progress Notes (Addendum)
Patient Name: Briana Kane Date of Encounter: 02/03/2016  Primary Cardiologist: Dr. Geronimo Boot Problem List     Active Problems:   Atrial fibrillation with RVR [facility])   Community acquired pneumonia   Bilateral leg edema   Generalized edema   SOB (shortness of breath)   Acute systolic CHF (congestive heart failure) (HCC)   Morbid obesity with BMI of 45.0-49.9, adult (HCC)     Subjective   Feels well, "tired" this am.  Did not sleep well overnight. Denies chest pain and palpitations.   Inpatient Medications    Scheduled Meds: . amiodarone  200 mg Oral BID  . apixaban  5 mg Oral BID  . azithromycin  500 mg Oral Q24H  . cefTRIAXone (ROCEPHIN)  IV  1 g Intravenous Q24H  . furosemide  20 mg Oral BID  . metoprolol tartrate  50 mg Oral BID   Continuous Infusions:  PRN Meds: acetaminophen, ALPRAZolam, metoprolol, ondansetron (ZOFRAN) IV   Vital Signs    Vitals:   02/02/16 1444 02/02/16 2000 02/03/16 0500 02/03/16 0641  BP: 98/81 (!) 87/77 98/81   Pulse: (!) 112 (!) 118 90   Resp: (!) 32 (!) 22 19   Temp: 98.4 F (36.9 C) 97.9 F (36.6 C) 97 F (36.1 C)   TempSrc: Axillary     SpO2: 94% 92% 93%   Weight:    (!) 314 lb (142.4 kg)  Height:        Intake/Output Summary (Last 24 hours) at 02/03/16 0957 Last data filed at 02/03/16 0644  Gross per 24 hour  Intake              702 ml  Output             1600 ml  Net             -898 ml   Filed Weights   01/31/16 0500 02/02/16 0805 02/03/16 0641  Weight: (!) 314 lb 3.2 oz (142.5 kg) (!) 314 lb 12.8 oz (142.8 kg) (!) 314 lb (142.4 kg)    Physical Exam   ZOX:WRUEA female in no acute distress.  HEENT:Grossly normal.  Neck:Supple, no JVD, carotid bruits, or masses. Cardiac:irreg. Irregular rhythm, no murmurs, rubs, or gallops. No clubbing, cyanosis, +2 pedal edema. Radials/DP/PT 2+ and equal bilaterally.  Respiratory:Respirations regular and unlabored, expiratory wheezing in upper lobes.    MS:no deformity or atrophy. Skin:warm and dry, no rash. Neuro:Strength and sensation are intact. Psych:AAOx3. Normal affect.  Labs    CBC  Recent Labs  02/02/16 0620 02/03/16 0542  WBC 8.4 6.8  HGB 15.5* 15.5*  HCT 46.4* 47.9*  MCV 88.7 91.2  PLT 190 196   Basic Metabolic Panel  Recent Labs  02/03/16 0542  NA 138  K 3.9  CL 96*  CO2 32  GLUCOSE 109*  BUN 23*  CREATININE 0.96  CALCIUM 8.2*     Telemetry     Afib, elevated rates in the low 100's - Personally Reviewed   Radiology    [facility] 01/29/16 PORTABLE CHEST 1 VIEW  COMPARISON:  None.  FINDINGS: Portable AP upright view at 1249 hours. Suggestion of cardiomegaly but other mediastinal contours appear normal. Veiling opacity at both lung bases. No superimposed pneumothorax or definite pulmonary edema. No other confluent pulmonary opacity. Negative visible bowel gas pattern in the upper abdomen.  IMPRESSION: 1. Veiling bibasilar pulmonary opacity is nonspecific but might relate to bilateral pleural effusions. PA and lateral views of the chest  recommended when possible. 2. Suggestion of cardiomegaly.  No acute pulmonary edema.    Cardiac Studies   Transthoracic Echocardiography Study Conclusions  - Left ventricle: The cavity size was normal. Wall thickness was   normal. Systolic function was severely reduced. The estimated   ejection fraction was in the range of 25% to 30%. Diffuse   hypokinesis. - Ventricular septum: The contour showed diastolic flattening. - Mitral valve: There was moderate regurgitation directed   posteriorly. - Left atrium: The atrium was moderately dilated. - Right ventricle: The cavity size was mildly dilated. Wall   thickness was normal. Systolic function was severely reduced. - Right atrium: The atrium was moderately dilated. - Tricuspid valve: There was moderate regurgitation.   Patient Profile     Briana Kane is an obese 66 year old female with no  past medical history who presented to the ED on 01/29/16 with suspected CAP and rapid Afib.   Assessment & Plan   1. Atrial fibrillation with RVR: Presents with fatigue, cough and found to be in Afib. Amiodarone added. TEE/DCCV 02/01/16- but back in AF  This patients CHA2DS2-VASc Score and unadjusted Ischemic Stroke Rate (% per year) is equal to 2.2 % stroke rate/year from a score of 2Above score calculated as 1 point each if present [CHF, HTN, DM, Vascular=MI/PAD/Aortic Plaque, Age if 65-74, or Female], 2 points each if present [Age >75, or Stroke/TIA/TE]  She has a CHA2DS2-VASc score of 2 for gender only, on eliquis. Would likely benefit from sleep study as outpatient.  2. CAP: Per primary team. I spoke to attending- the diagnosis of CAP was a "soft" call, I doubt persistent CAP is what caused her to go back into AF  3. Bilateral lower extremity edema: Transition to po Lasix, Panagiota Perfetti increase to 40mg  BID. It is possible that her volume overload is causing her AF control to be difficult.  4.- Super Obesity- BMI-50. I suspect sleep apnea, nocturnal hypoxemia may be contributing.  5. Cardiomyopathy, likely tachycardic medicated: Akyra Bouchie work for improve rate control which may be helped with diuresis. On metoprolol, Jaimes Eckert add 5 mg lisinopril.  Plan: Change to PO Amiodarone-200 mg BID. Continue Lopressor. Consider another attempt at cardioversion in a few weeks. MD to see.   Signed, Kodee Ravert [name], MD  02/03/2016, 9:57 AM

## 2016-02-03 NOTE — Progress Notes (Signed)
Progress Note    Briana Kane  ERX:[id number] DOB: [birth year]  DOA: 01/29/2016 PCP: Pcp Not In System    Brief Narrative:   Chief complaint: Follow-up atrial fibrillation  Briana Kane is an 66 y.o. female Without significant past medical history, does not follow with any PCP, he was complaints of cough and shortness of breath, workup significant for new onset atrial fibrillation, CAP, and volume overload.  Assessment/Plan:   Principal problems:  A fib with RVR/demand ischemia/elevated troponins Heart rate continues to be uncontrolled with rates up into the 130s despite therapy. Currently being managed with an amiodarone drip and metoprolol 50 mg twice a day with 5 mg as needed every 5 minutes for heart rate greater than 110. Diltiazem was not particularly effective so this was discontinued. She was initially treated with heparin, but is now on Eliquis. Mild elevation of troponins are likely related to demand ischemia in the setting of significant tachycardia. Trend is flat. Attempted DCCV 02/01/16, but it was not successful. Rate controlling medications being adjusted by cardiologist.  Volume overload/Acute systolic CHF Presents with bilateral lower extremity edema, and elevated BNP, and an EF of 25-30 percent with diffuse hypokinesis by 2-D echocardiogram done 01/31/16. I have increased her dose of diuretics given her complaints of breathlessness at times.  Active problems:  Morbid obesity with a BMI of 49.3 Body mass index is 49.3 kg/m. Counseled on weight loss.  Anxiety Xanax ordered as needed.  CAP Empirically treated with Rocephin/azithromycin. Repeated chest x-ray as pictured below. Findings appear to be more consistent with CHF, with probable bilateral pleural effusions. Could consider repeat chest radiography with lateral films or just follow clinically. Respiratory virus panel was negative. I have increased her dose of diuretics today.    Family  Communication/Anticipated D/C date and plan/Code Status   DVT prophylaxis: Eliquis ordered. Code Status: Full Code.  Family Communication: No family currently at the bedside. Disposition Plan: Home when HR controlled, still uncontrolled.   Medical Consultants:    Cardiology   Procedures:    DCCV 02/01/16: Not successful.  Anti-Infectives:    Rocephin 01/30/16--->  Azithromycin 01/30/16 --->  Subjective:   The patient reports that she was "panting" yesterday and had to take a xanax.  Felt anxious.    Objective:    Vitals:   02/03/16 0641 02/03/16 1001 02/03/16 1028 02/03/16 1403  BP:  (!) 114/94  (!) 142/103  Pulse:   (!) 111 85  Resp:    18  Temp:    97.3 F (36.3 C)  TempSrc:    Oral  SpO2:    95%  Weight: (!) 142.4 kg (314 lb)     Height:        Intake/Output Summary (Last 24 hours) at 02/03/16 1812 Last data filed at 02/03/16 1643  Gross per 24 hour  Intake              290 ml  Output             2200 ml  Net            -1910 ml   Filed Weights   01/31/16 0500 02/02/16 0805 02/03/16 0641  Weight: (!) 142.5 kg (314 lb 3.2 oz) (!) 142.8 kg (314 lb 12.8 oz) (!) 142.4 kg (314 lb)    Exam: General exam: Appears dyspneic. Respiratory system: Clear to auscultation. Respiratory effort Mildly labored. Cardiovascular system: Heart sounds irregularly irregular/tachycardic. No JVD,  rubs, gallops or clicks.  No murmurs. Gastrointestinal system: Abdomen is nondistended, soft and nontender. No organomegaly or masses felt. Normal bowel sounds heard. Central nervous system: Alert and oriented. No focal neurological deficits. Extremities: No clubbing,  or cyanosis. No edema. Skin: No rashes, lesions or ulcers. Psychiatry: Judgement and insight appear normal. Mood & affect appropriate.   Data Reviewed:   I have personally reviewed following labs and imaging studies:  Labs: Basic Metabolic Panel:  Recent Labs Lab 01/29/16 1316 01/30/16 0229 01/31/16 0707  02/03/16 0542  NA 137 136 138 138  K 3.9 4.0 3.9 3.9  CL 102 101 97* 96*  CO2 21* 23 31 32  GLUCOSE 113* 128* 111* 109*  BUN 12 16 18  23*  CREATININE 0.89 0.98 0.92 0.96  CALCIUM 9.2 8.9 8.8* 8.2*   GFR Estimated Creatinine Clearance: 88.9 mL/min (by C-G formula based on SCr of 0.96 mg/dL). Liver Function Tests:  Recent Labs Lab 01/29/16 1316  AST 35  ALT 27  ALKPHOS 62  BILITOT 1.8*  PROT 7.3  ALBUMIN 3.6   Coagulation profile  Recent Labs Lab 01/29/16 1316  INR 1.42    CBC:  Recent Labs Lab 01/30/16 0229 01/31/16 0707 02/01/16 0525 02/02/16 0620 02/03/16 0542  WBC 12.6* 10.4 12.5* 8.4 6.8  HGB 15.5* 15.3* 16.4* 15.5* 15.5*  HCT 46.1* 47.2* 48.8* 46.4* 47.9*  MCV 88.1 90.6 88.2 88.7 91.2  PLT 232 207 248 190 196   Cardiac Enzymes:  Recent Labs Lab 01/29/16 2245 01/30/16 0229 01/30/16 0921  TROPONINI 0.03* 0.04* 0.04*    Microbiology Recent Results (from the past 240 hour(s))  Respiratory Panel by PCR     Status: None   Collection Time: 01/29/16 11:42 PM  Result Value Ref Range Status   Adenovirus NOT DETECTED NOT DETECTED Final   Coronavirus 229E NOT DETECTED NOT DETECTED Final   Coronavirus HKU1 NOT DETECTED NOT DETECTED Final   Coronavirus NL63 NOT DETECTED NOT DETECTED Final   Coronavirus OC43 NOT DETECTED NOT DETECTED Final   Metapneumovirus NOT DETECTED NOT DETECTED Final   Rhinovirus / Enterovirus NOT DETECTED NOT DETECTED Final   Influenza A NOT DETECTED NOT DETECTED Final   Influenza B NOT DETECTED NOT DETECTED Final   Parainfluenza Virus 1 NOT DETECTED NOT DETECTED Final   Parainfluenza Virus 2 NOT DETECTED NOT DETECTED Final   Parainfluenza Virus 3 NOT DETECTED NOT DETECTED Final   Parainfluenza Virus 4 NOT DETECTED NOT DETECTED Final   Respiratory Syncytial Virus NOT DETECTED NOT DETECTED Final   Bordetella pertussis NOT DETECTED NOT DETECTED Final   Chlamydophila pneumoniae NOT DETECTED NOT DETECTED Final   Mycoplasma  pneumoniae NOT DETECTED NOT DETECTED Final  MRSA PCR Screening     Status: None   Collection Time: 01/30/16  1:07 AM  Result Value Ref Range Status   MRSA by PCR NEGATIVE NEGATIVE Final    Comment:        The GeneXpert MRSA Assay (FDA approved for NASAL specimens only), is one component of a comprehensive MRSA colonization surveillance program. It is not intended to diagnose MRSA infection nor to guide or monitor treatment for MRSA infections.     Radiology: Dg Chest Port 1 View  Result Date: 02/03/2016 CLINICAL DATA:  Pt here with suspected CAP and afib. Pt experiencing dyspnea. EXAM: PORTABLE CHEST 1 VIEW COMPARISON:  01/29/2016 FINDINGS: Bibasilar lung opacity obscures hemidiaphragms, similar to the prior exam. Remainder of the lungs is clear. No pneumothorax. Cardiac silhouette is mildly enlarged. No mediastinal or hilar masses.  IMPRESSION: 1. Persistent bibasilar opacity. This is likely a combination of small effusions with either atelectasis or pneumonia. No pulmonary edema. Mild cardiomegaly. Electronically Signed   By: Amie Portland M.D.   On: 02/03/2016 14:08    Medications:   . amiodarone  200 mg Oral BID  . apixaban  5 mg Oral BID  . azithromycin  500 mg Oral Q24H  . cefTRIAXone (ROCEPHIN)  IV  1 g Intravenous Q24H  . furosemide  40 mg Oral BID  . lisinopril  5 mg Oral Daily  . metoprolol tartrate  50 mg Oral BID   Continuous Infusions:   Medical decision making is of high complexity and this patient is at high risk of deterioration, therefore this is a level 3 visit.  (> 4 problem points, 3 data points, high risk)   Problems/DDx Points   Self limited or minor (max 2)       1   Established problem, stable       1   Established problem, worsening       2   New problem, no additional W/U planned (max 1)       3   New problem, additional W/U planned        4    Data Reviewed Points   Review/order clinical lab tests       1   Review/order x-rays       1     Review/order tests (Echo, EKG, PFTs, etc)       1   Discussion of test results w/ performing MD       1   Independent review of image, tracing or specimen       2   Decision to obtain old records       1   Review and summation of old records       2    Level of risk Presenting prob Diagnostics Management   Minimal 1 self limited/minor Labs CXR EKG/EEG U/A U/S Rest Gargles Bandages Dressings   Low 2 or more self limited/minor 1 stable chronic Acute uncomplicated illness Tests (PFTS) Non-CV imaging Arterial labs Biopsies of skin OTC drugs Minor surgery-no risk PT OT IVF without additives    Moderate 1 or more chronic illnesses w/ mild exac, progression or S/E from tx 2 or more stable chronic illnesses Undiagnosed new problem w/ uncertain prognosis Acute complicated injury  Stress tests Endoscopies with no risk factors Deep needle or incisional bx CV imaging without risk LP Thoracentesis Paracentesis Minor surgery w/ risks Elective major surgery w/ no risk (open, percutaneous or endoscopic) Prescription drugs Therapeutic nucl med IVF with additives Closed tx of fracture/dislocation    High Severe exac of chronic illness Acute or chronic illness/injury may pose a threat to life or bodily function (ARF) Change in neuro status    CV imaging w/ contrast and risk Cardio electophysiologic tests Endoscopies w/ risk Discography Elective major surgery Emergency major surgery Parenteral controlled substances Drug therapy req monitoring for toxicity DNR/de-escalation of care    MDM Prob points Data points Risk   Straightforward    <1    <1    Min   Low complexity    2    2    Low   Moderate    3    3    Mod   High Complexity    4 or more    4 or more    High  LOS: 4 days   RAMA,CHRISTINA  [facility] Pager [phone]. If unable to reach me by pager, please call my cell phone at [phone].  *Please refer to [email or web address], password [email or web address] to get updated  schedule on who will round on this patient, as hospitalists switch teams weekly. If 7PM-7AM, please contact night-coverage at [email or web address], password [email or web address] for any overnight needs.  02/03/2016, 6:12 PM

## 2016-02-04 LAB — BASIC METABOLIC PANEL
Anion gap: 11 (ref 5–15)
BUN: 22 mg/dL — ABNORMAL HIGH (ref 6–20)
CALCIUM: 8 mg/dL — AB (ref 8.9–10.3)
CO2: 32 mmol/L (ref 22–32)
Chloride: 96 mmol/L — ABNORMAL LOW (ref 101–111)
Creatinine, Ser: 0.82 mg/dL (ref 0.44–1.00)
GFR calc non Af Amer: >60 mL/min (ref >60)
Glucose, Bld: 103 mg/dL — ABNORMAL HIGH (ref 65–99)
Potassium: 3.4 mmol/L — ABNORMAL LOW (ref 3.5–5.1)
SODIUM: 139 mmol/L (ref 135–145)

## 2016-02-04 LAB — CBC
HCT: 46.3 % — ABNORMAL HIGH (ref 36.0–46.0)
Hemoglobin: 15 g/dL (ref 12.0–15.0)
MCH: 29.5 pg (ref 26.0–34.0)
MCHC: 32.4 g/dL (ref 30.0–36.0)
MCV: 91.1 fL (ref 78.0–100.0)
PLATELETS: 214 10*3/uL (ref 150–400)
RBC: 5.08 MIL/uL (ref 3.87–5.11)
RDW: 14.1 % (ref 11.5–15.5)
WBC: 7.7 10*3/uL (ref 4.0–10.5)

## 2016-02-04 MED ORDER — APIXABAN 5 MG PO TABS: 5.0000 mg | ORAL_TABLET | Freq: Two times a day (BID) | ORAL | 2 refills | Status: DC

## 2016-02-04 MED ORDER — FUROSEMIDE 40 MG PO TABS: 40.0000 mg | ORAL_TABLET | Freq: Two times a day (BID) | ORAL | 2 refills | Status: DC

## 2016-02-04 MED ORDER — METOPROLOL TARTRATE 50 MG PO TABS: 50.0000 mg | ORAL_TABLET | Freq: Two times a day (BID) | ORAL | 2 refills | Status: DC

## 2016-02-04 MED ORDER — AMIODARONE HCL 200 MG PO TABS: 200.0000 mg | ORAL_TABLET | Freq: Two times a day (BID) | ORAL | 2 refills | Status: DC

## 2016-02-04 MED ORDER — ALPRAZOLAM 0.5 MG PO TABS: 0.5000 mg | ORAL_TABLET | Freq: Three times a day (TID) | ORAL | 0 refills | Status: DC | PRN

## 2016-02-04 MED ORDER — LISINOPRIL 5 MG PO TABS: 5.0000 mg | ORAL_TABLET | Freq: Every day | ORAL | 2 refills | Status: DC

## 2016-02-04 NOTE — Discharge Summary (Signed)
Physician Discharge Summary  Briana Kane ZOX:[id number] DOB: October 10, 1950 DOA: 01/29/2016  PCP: Pcp Not In System  Admit date: 01/29/2016 Discharge date: 02/04/2016  Admitted From: Home Discharge disposition: Home   Recommendations for Outpatient Follow-Up:   1. The patient will follow-up with cardiology. Please check a BMET in 1 week.   Discharge Diagnosis:   Principal Problem:    Acute systolic CHF (congestive heart failure) (HCC) Active Problems:    Atrial fibrillation with RVR (HCC)    Community acquired pneumonia    Bilateral leg edema    Generalized edema    Dyspnea    Morbid obesity with BMI of 45.0-49.9, adult (HCC)    Demand ischemia    Elevated troponin    Discharge Condition: Improved.  Diet recommendation: Low sodium, heart healthy.    History of Present Illness:    Briana Kane is an 66 y.o. female Without significant past medical history, does not follow with any PCP, he was complaints of cough and shortness of breath, workup significant for new onset atrial fibrillation, CAP, and volume overload.   Hospital Course by Problem:   A fib with RVR/demand ischemia/elevated troponins Initially managed with an amiodarone drip and metoprolol 50 mg twice a day with 5 mg as needed every 5 minutes for heart rate greater than 110. Diltiazem was not particularly effective so this was discontinued. She was initially treated with heparin, but is now on Eliquis. Mild elevation of troponins are likely related to demand ischemia in the setting of significant tachycardia. Trend is flat. Attempted DCCV 02/01/16, but it was not successful. Rate controlling medications adjusted with better heart rate control at discharge.  Volume overload/Acute systolic CHF Presents with bilateral lower extremity edema, and elevated BNP, and an EF of 25-30 percent with diffuse hypokinesis by 2-D echocardiogram done 01/31/16. Discharged on Lasix 40 mg twice a day to achieve  better diuresis. Will need close follow-up with cardiology to assure stability of electrolytes and to assess 5 AM status.  Active problems:  Morbid obesity with a BMI of 49.3 Body mass index is 49.3 kg/m. Counseled on weight loss.  Anxiety Xanax ordered as needed.  CAP Empirically treated with Rocephin/azithromycin. Repeated chest x-ray as pictured below. Findings appear to be more consistent with CHF, with probable bilateral pleural effusions.  Respiratory virus panel was negative.      Medical Consultants:    Cardiology   Discharge Exam:   Vitals:   02/03/16 2148 02/04/16 0500  BP: (!) 126/102 (!) 117/97  Pulse: (!) 105 (!) 106  Resp:  (!) 21  Temp:  97 F (36.1 C)   Vitals:   02/03/16 2100 02/03/16 2147 02/03/16 2148 02/04/16 0500  BP: (!) 118/103 (!) 127/102 (!) 126/102 (!) 117/97  Pulse: 95  (!) 105 (!) 106  Resp: (!) 28 (!) 23  (!) 21  Temp: 97 F (36.1 C)   97 F (36.1 C)  TempSrc:      SpO2: 96%   96%  Weight:    (!) 142 kg (313 lb 1.6 oz)  Height:        General exam: Appears calm and comfortable.  Respiratory system: Clear to auscultation. Respiratory effort normal. Cardiovascular system: Heart sounds are irregular. No JVD,  rubs, gallops or clicks. No murmurs. Gastrointestinal system: Abdomen is nondistended, soft and nontender. No organomegaly or masses felt. Normal bowel sounds heard. Central nervous system: Alert and oriented. No focal neurological deficits. Extremities: No clubbing,  or cyanosis. 2+ edema.  Skin: No rashes, lesions or ulcers. Psychiatry: Judgement and insight appear normal. Mood & affect appropriate.    The results of significant diagnostics from this hospitalization (including imaging, microbiology, ancillary and laboratory) are listed below for reference.     Procedures and Diagnostic Studies:   Dg Chest Portable 1 View  Result Date: 01/29/2016 CLINICAL DATA:  66 year old female with shortness of breath and cough for  2 weeks. Former smoker. Initial encounter. EXAM: PORTABLE CHEST 1 VIEW COMPARISON:  None. FINDINGS: Portable AP upright view at 1249 hours. Suggestion of cardiomegaly but other mediastinal contours appear normal. Veiling opacity at both lung bases. No superimposed pneumothorax or definite pulmonary edema. No other confluent pulmonary opacity. Negative visible bowel gas pattern in the upper abdomen. IMPRESSION: 1. Veiling bibasilar pulmonary opacity is nonspecific but might relate to bilateral pleural effusions. PA and lateral views of the chest recommended when possible. 2. Suggestion of cardiomegaly.  No acute pulmonary edema. Electronically Signed   By: Odessa Fleming M.D.   On: 01/29/2016 13:06     Labs:   Basic Metabolic Panel:  Recent Labs Lab 01/29/16 1316 01/30/16 0229 01/31/16 0707 02/03/16 0542 02/04/16 0510  NA 137 136 138 138 139  K 3.9 4.0 3.9 3.9 3.4*  CL 102 101 97* 96* 96*  CO2 21* 23 31 32 32  GLUCOSE 113* 128* 111* 109* 103*  BUN 12 16 18  23* 22*  CREATININE 0.89 0.98 0.92 0.96 0.82  CALCIUM 9.2 8.9 8.8* 8.2* 8.0*   GFR Estimated Creatinine Clearance: 104 mL/min (by C-G formula based on SCr of 0.82 mg/dL). Liver Function Tests:  Recent Labs Lab 01/29/16 1316  AST 35  ALT 27  ALKPHOS 62  BILITOT 1.8*  PROT 7.3  ALBUMIN 3.6   No results for input(s): LIPASE, AMYLASE in the last 168 hours. No results for input(s): AMMONIA in the last 168 hours. Coagulation profile  Recent Labs Lab 01/29/16 1316  INR 1.42    CBC:  Recent Labs Lab 01/31/16 0707 02/01/16 0525 02/02/16 0620 02/03/16 0542 02/04/16 0510  WBC 10.4 12.5* 8.4 6.8 7.7  HGB 15.3* 16.4* 15.5* 15.5* 15.0  HCT 47.2* 48.8* 46.4* 47.9* 46.3*  MCV 90.6 88.2 88.7 91.2 91.1  PLT 207 248 190 196 214   Cardiac Enzymes:  Recent Labs Lab 01/29/16 2245 01/30/16 0229 01/30/16 0921  TROPONINI 0.03* 0.04* 0.04*   BNP: Invalid input(s): POCBNP CBG: No results for input(s): GLUCAP in the last 168  hours. D-Dimer No results for input(s): DDIMER in the last 72 hours. Hgb A1c No results for input(s): HGBA1C in the last 72 hours. Lipid Profile No results for input(s): CHOL, HDL, LDLCALC, TRIG, CHOLHDL, LDLDIRECT in the last 72 hours. Thyroid function studies No results for input(s): TSH, T4TOTAL, T3FREE, THYROIDAB in the last 72 hours.  Invalid input(s): FREET3 Anemia work up No results for input(s): VITAMINB12, FOLATE, FERRITIN, TIBC, IRON, RETICCTPCT in the last 72 hours. Microbiology Recent Results (from the past 240 hour(s))  Respiratory Panel by PCR     Status: None   Collection Time: 01/29/16 11:42 PM  Result Value Ref Range Status   Adenovirus NOT DETECTED NOT DETECTED Final   Coronavirus 229E NOT DETECTED NOT DETECTED Final   Coronavirus HKU1 NOT DETECTED NOT DETECTED Final   Coronavirus NL63 NOT DETECTED NOT DETECTED Final   Coronavirus OC43 NOT DETECTED NOT DETECTED Final   Metapneumovirus NOT DETECTED NOT DETECTED Final   Rhinovirus / Enterovirus NOT DETECTED NOT DETECTED Final   Influenza A  NOT DETECTED NOT DETECTED Final   Influenza B NOT DETECTED NOT DETECTED Final   Parainfluenza Virus 1 NOT DETECTED NOT DETECTED Final   Parainfluenza Virus 2 NOT DETECTED NOT DETECTED Final   Parainfluenza Virus 3 NOT DETECTED NOT DETECTED Final   Parainfluenza Virus 4 NOT DETECTED NOT DETECTED Final   Respiratory Syncytial Virus NOT DETECTED NOT DETECTED Final   Bordetella pertussis NOT DETECTED NOT DETECTED Final   Chlamydophila pneumoniae NOT DETECTED NOT DETECTED Final   Mycoplasma pneumoniae NOT DETECTED NOT DETECTED Final  MRSA PCR Screening     Status: None   Collection Time: 01/30/16  1:07 AM  Result Value Ref Range Status   MRSA by PCR NEGATIVE NEGATIVE Final    Comment:        The GeneXpert MRSA Assay (FDA approved for NASAL specimens only), is one component of a comprehensive MRSA colonization surveillance program. It is not intended to diagnose  MRSA infection nor to guide or monitor treatment for MRSA infections.      Discharge Instructions:   Discharge Instructions    (HEART FAILURE PATIENTS) Call MD:  Anytime you have any of the following symptoms: 1) 3 pound weight gain in 24 hours or 5 pounds in 1 week 2) shortness of breath, with or without a dry hacking cough 3) swelling in the hands, feet or stomach 4) if you have to sleep on extra pillows at night in order to breathe.    Complete by:  As directed    Call MD for:  extreme fatigue    Complete by:  As directed    Call MD for:  persistant dizziness or light-headedness    Complete by:  As directed    Diet - low sodium heart healthy    Complete by:  As directed    Discharge instructions    Complete by:  As directed    You were treated for heart failure in the hospital.  To prevent exacerbations of your heart failure, it is important that you check your weight at the same time every day, and that if you gain over 2 pounds in 24 hours or 5 lbs in 1 week, OR you develop worsening swelling to the legs, experience more shortness of breath or chest pain, take an extra dose of Lasix and call your Primary MD or cardiologist.   Follow a heart healthy, low salt diet and restrict your fluid intake to 1.5 liters a day or less.   Increase activity slowly    Complete by:  As directed      Allergies as of 02/04/2016      Reactions   Gluten Meal Shortness Of Breath, Other (See Comments)   Triggered fluid around her heart a couple of years ago (NO BREAD OR PASTA)   Wheat Bran Shortness Of Breath   Also, possible edema (around heart)   Mold Extract [trichophyton] Other (See Comments)   Causes congestion (sudden)   Neosporin [neomycin-bacitracin Zn-polymyx] Rash   Other Rash   Neoprene (NO!)   Petroleum Jelly Lip Treatment [dimethicone] Rash   Tape Rash      Medication List    TAKE these medications   ALPRAZolam 0.5 MG tablet Commonly known as:  XANAX Take 1 tablet (0.5 mg  total) by mouth 3 (three) times daily as needed for anxiety.   amiodarone 200 MG tablet Commonly known as:  PACERONE Take 1 tablet (200 mg total) by mouth 2 (two) times daily.   apixaban 5 MG Tabs  tablet Commonly known as:  ELIQUIS Take 1 tablet (5 mg total) by mouth 2 (two) times daily.   B COMPLEX PO Take 1 tablet by mouth 2 (two) times daily.   COENZYME Q-10 PO Take 1 capsule by mouth 4 (four) times daily.   furosemide 40 MG tablet Commonly known as:  LASIX Take 1 tablet (40 mg total) by mouth 2 (two) times daily.   HAWTHORNE BERRY PO Take 1 capsule by mouth 2 (two) times daily.   lisinopril 5 MG tablet Commonly known as:  PRINIVIL,ZESTRIL Take 1 tablet (5 mg total) by mouth daily.   MAGNESIUM PO Take 1,000 mg by mouth daily.   metoprolol 50 MG tablet Commonly known as:  LOPRESSOR Take 1 tablet (50 mg total) by mouth 2 (two) times daily.   NIACIN PO Take 2 tablets by mouth 2 (two) times daily.   OVER THE COUNTER MEDICATION Beet Root: Take 1 capsule by mouth four times a day   OVER THE COUNTER MEDICATION Ashwaganda capsules: Take 2 capsules by mouth two times a day   OVER THE COUNTER MEDICATION Moringa capsules: Take 2 capsules by mouth two times a day   POTASSIUM CHLORIDE PO Take 1,000 mg by mouth daily.   Turmeric Curcumin Caps Take 2 capsules by mouth 2 (two) times daily.      Follow-up Information    Verne Carrow, MD. Schedule an appointment as soon as possible for a visit in 1 week(s).   Specialty:  Cardiology Why:  Follow up labs, cardiology recommendations. Contact information: [address] [place] [address] [phone]        Dois Davenport., MD. Schedule an appointment as soon as possible for a visit.   Specialty:  Family Medicine Why:  To establish care with a PCP. Contact information: [address] [place] [address] [phone]            Time coordinating discharge: > 30  minutes.  Signed:  RAMA,CHRISTINA  Pager [phone] [facility] 02/04/2016, 4:41 PM

## 2016-02-16 ENCOUNTER — Encounter: Payer: Self-pay | Admitting: *Deleted

## 2016-02-19 ENCOUNTER — Encounter: Payer: Self-pay | Admitting: Physician Assistant

## 2016-02-19 ENCOUNTER — Ambulatory Visit (INDEPENDENT_AMBULATORY_CARE_PROVIDER_SITE_OTHER): Payer: BLUE CROSS/BLUE SHIELD | Admitting: Physician Assistant

## 2016-02-19 VITALS — BP 124/64 | HR 91 | Ht 67.0 in | Wt 281.0 lb

## 2016-02-19 DIAGNOSIS — Z01812 Encounter for preprocedural laboratory examination: Secondary | ICD-10-CM | POA: Diagnosis not present

## 2016-02-19 DIAGNOSIS — I5021 Acute systolic (congestive) heart failure: Secondary | ICD-10-CM | POA: Diagnosis not present

## 2016-02-19 DIAGNOSIS — I420 Dilated cardiomyopathy: Secondary | ICD-10-CM

## 2016-02-19 DIAGNOSIS — Z6841 Body Mass Index (BMI) 40.0 and over, adult: Secondary | ICD-10-CM

## 2016-02-19 DIAGNOSIS — I4891 Unspecified atrial fibrillation: Secondary | ICD-10-CM

## 2016-02-19 NOTE — Progress Notes (Signed)
Cardiology Office Note    Date:  02/19/2016   ID:  Briana Kane, DOB [birth year], MRN [id number]  PCP:  Pcp Not In System  Cardiologist: Dr. Elberta Fortis  Chief Complaint  Patient presents with  . Follow-up    History of Present Illness:  Briana Kane is a 66 y.o. female with no prior cardiac history who presented to the hospital 01/29/16 with suspected pneumonia and rapid A. fib. Amiodarone was added TEE/DC CV 02/01/16 but converted back to atrial fibrillation.CHADSVASC=2 so Eliquis added. Sleep study recommended. Patient also have bilateral lower extremity edema treated with Lasix felt volume overload was causing her A. fib to be difficult to control. She developed a cardiomyopathy LVEF 25-30% with diffuse hypokinesis and RV was severely reduced. LA moderately dilated moderate MR. Suspect tachycardia mediated that will improve. Dr. Elberta Fortis recommended another attempt at [facility] in a few weeks.  Patient comes in today for follow-up. Her weight went from 313 pounds down to 283 pounds today. She can't feel her heart skipping. She is feeling much better. She is complaining of some blurred vision that she thinks is coming from the Lasix.    Past Medical History:  Diagnosis Date  . Acute systolic CHF (congestive heart failure) (HCC) 02/02/2016  . Atrial fibrillation with RVR (HCC) 01/29/2016  . Bilateral leg edema 01/29/2016  . Community acquired pneumonia 01/29/2016  . Dyspnea   . Generalized edema   . Morbid obesity with BMI of 45.0-49.9, adult (HCC) 02/02/2016    Past Surgical History:  Procedure Laterality Date  . CARDIOVERSION N/A 02/01/2016   Procedure: CARDIOVERSION;  Surgeon: [name], MD;  Location: [facility] ENDOSCOPY;  Service: Cardiovascular;  Laterality: N/A;  . TEE WITHOUT CARDIOVERSION N/A 02/01/2016   Procedure: TRANSESOPHAGEAL ECHOCARDIOGRAM (TEE);  Surgeon: [name], MD;  Location: [facility] ENDOSCOPY;  Service: Cardiovascular;  Laterality: N/A;    Current  Medications: Outpatient Medications Prior to Visit  Medication Sig Dispense Refill  . ALPRAZolam (XANAX) 0.5 MG tablet Take 1 tablet (0.5 mg total) by mouth 3 (three) times daily as needed for anxiety. 20 tablet 0  . amiodarone (PACERONE) 200 MG tablet Take 1 tablet (200 mg total) by mouth 2 (two) times daily. 60 tablet 2  . apixaban (ELIQUIS) 5 MG TABS tablet Take 1 tablet (5 mg total) by mouth 2 (two) times daily. 60 tablet 2  . B Complex Vitamins (B COMPLEX PO) Take 1 tablet by mouth 2 (two) times daily.    Marland Kitchen COENZYME Q-10 PO Take 1 capsule by mouth 4 (four) times daily.    . furosemide (LASIX) 40 MG tablet Take 1 tablet (40 mg total) by mouth 2 (two) times daily. 60 tablet 2  . HAWTHORNE BERRY PO Take 1 capsule by mouth 2 (two) times daily.    Marland Kitchen lisinopril (PRINIVIL,ZESTRIL) 5 MG tablet Take 1 tablet (5 mg total) by mouth daily. 30 tablet 2  . MAGNESIUM PO Take 1,000 mg by mouth daily.    . metoprolol (LOPRESSOR) 50 MG tablet Take 1 tablet (50 mg total) by mouth 2 (two) times daily. 60 tablet 2  . Misc Natural Products (TURMERIC CURCUMIN) CAPS Take 2 capsules by mouth 2 (two) times daily.    Marland Kitchen NIACIN PO Take 2 tablets by mouth 2 (two) times daily.    Marland Kitchen OVER THE COUNTER MEDICATION Beet Root: Take 1 capsule by mouth four times a day    . OVER THE COUNTER MEDICATION Ashwaganda capsules: Take 2 capsules by mouth two  times a day    . OVER THE COUNTER MEDICATION Moringa capsules: Take 2 capsules by mouth two times a day    . POTASSIUM CHLORIDE PO Take 1,000 mg by mouth daily.     No facility-administered medications prior to visit.      Allergies:   Gluten meal; Wheat bran; Mold extract [trichophyton]; Neosporin [neomycin-bacitracin zn-polymyx]; Other; Petroleum jelly lip treatment [dimethicone]; and Tape   Social History   Social History  . Marital status: Unknown    Spouse name: N/A  . Number of children: N/A  . Years of education: N/A   Social History Main Topics  . Smoking status:  Former Smoker    Quit date: 01/29/1991  . Smokeless tobacco: Never Used  . Alcohol use No  . Drug use: No  . Sexual activity: Not on file   Other Topics Concern  . Not on file   Social History Narrative  . No narrative on file     Family History:  The patient's family history includes Hypertension in her mother.   ROS:   Please see the history of present illness.    Review of Systems  Constitution: Negative.  HENT: Negative.   Eyes: Positive for visual disturbance.  Cardiovascular: Positive for dyspnea on exertion.  Respiratory: Negative.   Hematologic/Lymphatic: Negative.   Musculoskeletal: Negative.  Negative for joint pain.  Gastrointestinal: Negative.   Genitourinary: Negative.   Neurological: Negative.    All other systems reviewed and are negative.   PHYSICAL EXAM:   VS:  BP 124/64   Pulse 91   Ht 5\' 7"  (1.702 m)   Wt 281 lb (127.5 kg)   BMI 44.01 kg/m   Physical Exam  GEN: Obese, in no acute distress  Neck: no JVD, carotid bruits, or masses Cardiac irregular irregular, distant heart sounds; no murmurs, rubs, or gallops  Respiratory:  clear to auscultation bilaterally, normal work of breathing GI: soft, nontender, nondistended, + BS Ext: +2 edema bilaterally without cyanosis, clubbing,  Good distal pulses bilaterally Psych: euthymic mood, full affect  Wt Readings from Last 3 Encounters:  02/19/16 281 lb (127.5 kg)  02/04/16 (!) 313 lb 1.6 oz (142 kg)      Studies/Labs Reviewed:   EKG:  EKG is  ordered today.  The ekg ordered today demonstrates Atrial flutter at 91 bpm  Recent Labs: 01/29/2016: ALT 27; B Natriuretic Peptide 513.8; TSH 2.363 02/04/2016: BUN 22; Creatinine, Ser 0.82; Hemoglobin 15.0; Platelets 214; Potassium 3.4; Sodium 139   Lipid Panel    Component Value Date/Time   CHOL 107 01/30/2016 0229   TRIG 61 01/30/2016 0229   HDL 41 01/30/2016 0229   CHOLHDL 2.6 01/30/2016 0229   VLDL 12 01/30/2016 0229   LDLCALC 54 01/30/2016 0229     Additional studies/ records that were reviewed today include:  Transthoracic Echocardiography Study Conclusions   - Left ventricle: The cavity size was normal. Wall thickness was   normal. Systolic function was severely reduced. The estimated   ejection fraction was in the range of 25% to 30%. Diffuse   hypokinesis. - Ventricular septum: The contour showed diastolic flattening. - Mitral valve: There was moderate regurgitation directed   posteriorly. - Left atrium: The atrium was moderately dilated. - Right ventricle: The cavity size was mildly dilated. Wall   thickness was normal. Systolic function was severely reduced. - Right atrium: The atrium was moderately dilated. - Tricuspid valve: There was moderate regurgitation.       ASSESSMENT:  1. Pre-procedure lab exam   2. Atrial fibrillation with RVR (HCC)   3. Cardiomyopathy, dilated (HCC)   4. Acute systolic CHF (congestive heart failure) (HCC)   5. Morbid obesity with BMI of 45.0-49.9, adult (HCC)      PLAN:  In order of problems listed above:  Preop lab exam prior to cardioversion  Atrial fibrillation/flutter with rate of 91 bpm. On amiodarone 400 mg daily. Will have a full load next week. Also on Eliqui. Hasn't missed any doses. She had TEE/DC CV in the hospital and converted to normal sinus rhythm but reverted back to atrial fibrillation. Discussed with Dr. Clifton James in detail.Plan is to cardiovert next week in hopes that her LV function will normalize. Follow-up with Dr.Camnitz after.  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 Hopefully this will normalize when she converts to normal sinus rhythm. Will need follow-up 2-D echo.   Acute systolic CHF: Patient still has some edema so we'll continue Lasix 40 mg twice a day. Check renal function today.  Morbid obesity will need to begin a weight loss program.      Medication Adjustments/Labs and Tests Ordered: Current medicines are reviewed  at length with the patient today.  Concerns regarding medicines are outlined above.  Medication changes, Labs and Tests ordered today are listed in the Patient Instructions below. Patient Instructions  Medication Instructions:  Your physician recommends that you continue on your current medications as directed. Please refer to the Current Medication list given to you today.  Labwork: Your physician recommends that you have lab work today- BMET, CBC, and PT/INR  Testing/Procedures: Your physician has recommended that you have a Cardioversion (DCCV). Electrical Cardioversion uses a jolt of electricity to your heart either through paddles or wired patches attached to your chest. This is a controlled, usually prescheduled, procedure. Defibrillation is done under light anesthesia in the hospital, and you usually go home the day of the procedure. This is done to get your heart back into a normal rhythm. You are not awake for the procedure. Please see the instruction sheet given to you today.  Follow-Up: Your physician wants you to follow-up in: 3 weeks with Dr. Elberta Fortis.  If you need a refill on your cardiac medications before your next appointment, please call your pharmacy.       Elson Clan, PA-C  02/19/2016 2:59 PM    [facility] [address] [place], [place], [place]  [address] Phone: [phone]; Fax: [phone]

## 2016-02-19 NOTE — Patient Instructions (Addendum)
Medication Instructions:  Your physician recommends that you continue on your current medications as directed. Please refer to the Current Medication list given to you today.  Labwork: Your physician recommends that you have lab work today- BMET, CBC, and PT/INR  Testing/Procedures: Your physician has recommended that you have a Cardioversion (DCCV). Electrical Cardioversion uses a jolt of electricity to your heart either through paddles or wired patches attached to your chest. This is a controlled, usually prescheduled, procedure. Defibrillation is done under light anesthesia in the hospital, and you usually go home the day of the procedure. This is done to get your heart back into a normal rhythm. You are not awake for the procedure. Please see the instruction sheet given to you today.  Follow-Up: Your physician wants you to follow-up in: 3 weeks with Dr. Elberta Fortis.  If you need a refill on your cardiac medications before your next appointment, please call your pharmacy.

## 2016-02-20 LAB — CBC WITH DIFFERENTIAL/PLATELET
BASOS: 1 %
Basophils Absolute: 0.1 10*3/uL (ref 0.0–0.2)
EOS (ABSOLUTE): 0.2 10*3/uL (ref 0.0–0.4)
Eos: 3 %
HEMATOCRIT: 47.6 % — AB (ref 34.0–46.6)
Hemoglobin: 15.5 g/dL (ref 11.1–15.9)
Immature Grans (Abs): 0 10*3/uL (ref 0.0–0.1)
Immature Granulocytes: 0 %
LYMPHS ABS: 1.8 10*3/uL (ref 0.7–3.1)
Lymphs: 25 %
MCH: 28.9 pg (ref 26.6–33.0)
MCHC: 32.6 g/dL (ref 31.5–35.7)
MCV: 89 fL (ref 79–97)
Monocytes Absolute: 0.5 10*3/uL (ref 0.1–0.9)
Monocytes: 7 %
Neutrophils Absolute: 4.6 10*3/uL (ref 1.4–7.0)
Neutrophils: 64 %
Platelets: 245 10*3/uL (ref 150–379)
RBC: 5.36 x10E6/uL — ABNORMAL HIGH (ref 3.77–5.28)
RDW: 15.1 % (ref 12.3–15.4)
WBC: 7.2 10*3/uL (ref 3.4–10.8)

## 2016-02-20 LAB — BASIC METABOLIC PANEL
BUN/Creatinine Ratio: 24 (ref 12–28)
BUN: 23 mg/dL (ref 8–27)
CALCIUM: 9.3 mg/dL (ref 8.7–10.3)
CO2: 22 mmol/L (ref 18–29)
CREATININE: 0.94 mg/dL (ref 0.57–1.00)
Chloride: 99 mmol/L (ref 96–106)
GFR, EST AFRICAN AMERICAN: 74 mL/min/{1.73_m2} (ref >59)
GFR, EST NON AFRICAN AMERICAN: 64 mL/min/{1.73_m2} (ref >59)
Glucose: 94 mg/dL (ref 65–99)
POTASSIUM: 4.4 mmol/L (ref 3.5–5.2)
SODIUM: 144 mmol/L (ref 134–144)

## 2016-02-20 LAB — PROTIME-INR
INR: 1.2 (ref 0.8–1.2)
Prothrombin Time: 12.2 s — ABNORMAL HIGH (ref 9.1–12.0)

## 2016-02-23 ENCOUNTER — Telehealth: Payer: Self-pay | Admitting: Physician Assistant

## 2016-02-23 NOTE — Telephone Encounter (Signed)
New message      Pt is due to have a cardioversion on Monday. Today she is sick---headache, fever, runny nose and cough.  Talk to a nurse to get weekend options if she is still sick on Monday.

## 2016-02-23 NOTE — Telephone Encounter (Signed)
Spoke with Dr. Eldridge Dace re: pt not feeling well with fever, chills, cough, and runny nose, to see if we needed to cancel her DCCV.  Per Dr. Eldridge Dace, let's wait til Monday to see how the pt feels and have her call us then we will go from there.  Pt has been advised to call us Monday morning, 02/26/16, and we will go from there.  She is agreeable with this plan and verbalized understanding.

## 2016-02-26 ENCOUNTER — Ambulatory Visit (HOSPITAL_COMMUNITY): Payer: BLUE CROSS/BLUE SHIELD | Admitting: Certified Registered Nurse Anesthetist

## 2016-02-26 ENCOUNTER — Encounter: Payer: Self-pay | Admitting: Physician Assistant

## 2016-02-26 ENCOUNTER — Encounter (HOSPITAL_COMMUNITY): Payer: Self-pay | Admitting: *Deleted

## 2016-02-26 ENCOUNTER — Encounter (HOSPITAL_COMMUNITY)
Admission: RE | Disposition: A | Discharge status: Home / Self Care | Payer: Self-pay | Source: Ambulatory Visit | Attending: Cardiology

## 2016-02-26 ENCOUNTER — Ambulatory Visit (HOSPITAL_COMMUNITY)
Admission: RE | Admit: 2016-02-26 | Discharge: 2016-02-26 | Disposition: A | Discharge status: Home / Self Care | Payer: BLUE CROSS/BLUE SHIELD | Source: Ambulatory Visit | Attending: Cardiology | Admitting: Cardiology

## 2016-02-26 DIAGNOSIS — I4891 Unspecified atrial fibrillation: Secondary | ICD-10-CM | POA: Insufficient documentation

## 2016-02-26 DIAGNOSIS — Z7901 Long term (current) use of anticoagulants: Secondary | ICD-10-CM | POA: Diagnosis not present

## 2016-02-26 DIAGNOSIS — Z79899 Other long term (current) drug therapy: Secondary | ICD-10-CM | POA: Insufficient documentation

## 2016-02-26 DIAGNOSIS — Z6841 Body Mass Index (BMI) 40.0 and over, adult: Secondary | ICD-10-CM | POA: Insufficient documentation

## 2016-02-26 DIAGNOSIS — I420 Dilated cardiomyopathy: Secondary | ICD-10-CM | POA: Diagnosis not present

## 2016-02-26 DIAGNOSIS — I481 Persistent atrial fibrillation: Secondary | ICD-10-CM | POA: Diagnosis not present

## 2016-02-26 DIAGNOSIS — I5021 Acute systolic (congestive) heart failure: Secondary | ICD-10-CM | POA: Diagnosis not present

## 2016-02-26 DIAGNOSIS — Z87891 Personal history of nicotine dependence: Secondary | ICD-10-CM | POA: Insufficient documentation

## 2016-02-26 DIAGNOSIS — I4819 Other persistent atrial fibrillation: Secondary | ICD-10-CM

## 2016-02-26 HISTORY — PX: CARDIOVERSION: SHX1299

## 2016-02-26 SURGERY — CARDIOVERSION
Anesthesia: General

## 2016-02-26 MED ORDER — PROPOFOL 10 MG/ML IV BOLUS
INTRAVENOUS | Status: DC | PRN
  Administered 2016-02-26: 80 mg via INTRAVENOUS

## 2016-02-26 MED ORDER — LIDOCAINE 2% (20 MG/ML) 5 ML SYRINGE
INTRAMUSCULAR | Status: DC | PRN
  Administered 2016-02-26: 40 mg via INTRAVENOUS

## 2016-02-26 MED ORDER — SODIUM CHLORIDE 0.9 % IV SOLN: INTRAVENOUS | Status: DC

## 2016-02-26 NOTE — H&P (View-Only) (Signed)
Cardiology Office Note    Date:  02/19/2016   ID:  Briana Kane, DOB [birth year], MRN [id number]  PCP:  Pcp Not In System  Cardiologist: Dr. Elberta Fortis  Chief Complaint  Patient presents with  . Follow-up    History of Present Illness:  Briana Kane is a 66 y.o. female with no prior cardiac history who presented to the hospital 01/29/16 with suspected pneumonia and rapid A. fib. Amiodarone was added TEE/DC CV 02/01/16 but converted back to atrial fibrillation.CHADSVASC=2 so Eliquis added. Sleep study recommended. Patient also have bilateral lower extremity edema treated with Lasix felt volume overload was causing her A. fib to be difficult to control. She developed a cardiomyopathy LVEF 25-30% with diffuse hypokinesis and RV was severely reduced. LA moderately dilated moderate MR. Suspect tachycardia mediated that will improve. Dr. Elberta Fortis recommended another attempt at [facility] in a few weeks.  Patient comes in today for follow-up. Her weight went from 313 pounds down to 283 pounds today. She can't feel her heart skipping. She is feeling much better. She is complaining of some blurred vision that she thinks is coming from the Lasix.    Past Medical History:  Diagnosis Date  . Acute systolic CHF (congestive heart failure) (HCC) 02/02/2016  . Atrial fibrillation with RVR (HCC) 01/29/2016  . Bilateral leg edema 01/29/2016  . Community acquired pneumonia 01/29/2016  . Dyspnea   . Generalized edema   . Morbid obesity with BMI of 45.0-49.9, adult (HCC) 02/02/2016    Past Surgical History:  Procedure Laterality Date  . CARDIOVERSION N/A 02/01/2016   Procedure: CARDIOVERSION;  Surgeon: [name], MD;  Location: [facility] ENDOSCOPY;  Service: Cardiovascular;  Laterality: N/A;  . TEE WITHOUT CARDIOVERSION N/A 02/01/2016   Procedure: TRANSESOPHAGEAL ECHOCARDIOGRAM (TEE);  Surgeon: [name], MD;  Location: [facility] ENDOSCOPY;  Service: Cardiovascular;  Laterality: N/A;    Current  Medications: Outpatient Medications Prior to Visit  Medication Sig Dispense Refill  . ALPRAZolam (XANAX) 0.5 MG tablet Take 1 tablet (0.5 mg total) by mouth 3 (three) times daily as needed for anxiety. 20 tablet 0  . amiodarone (PACERONE) 200 MG tablet Take 1 tablet (200 mg total) by mouth 2 (two) times daily. 60 tablet 2  . apixaban (ELIQUIS) 5 MG TABS tablet Take 1 tablet (5 mg total) by mouth 2 (two) times daily. 60 tablet 2  . B Complex Vitamins (B COMPLEX PO) Take 1 tablet by mouth 2 (two) times daily.    Marland Kitchen COENZYME Q-10 PO Take 1 capsule by mouth 4 (four) times daily.    . furosemide (LASIX) 40 MG tablet Take 1 tablet (40 mg total) by mouth 2 (two) times daily. 60 tablet 2  . HAWTHORNE BERRY PO Take 1 capsule by mouth 2 (two) times daily.    Marland Kitchen lisinopril (PRINIVIL,ZESTRIL) 5 MG tablet Take 1 tablet (5 mg total) by mouth daily. 30 tablet 2  . MAGNESIUM PO Take 1,000 mg by mouth daily.    . metoprolol (LOPRESSOR) 50 MG tablet Take 1 tablet (50 mg total) by mouth 2 (two) times daily. 60 tablet 2  . Misc Natural Products (TURMERIC CURCUMIN) CAPS Take 2 capsules by mouth 2 (two) times daily.    Marland Kitchen NIACIN PO Take 2 tablets by mouth 2 (two) times daily.    Marland Kitchen OVER THE COUNTER MEDICATION Beet Root: Take 1 capsule by mouth four times a day    . OVER THE COUNTER MEDICATION Ashwaganda capsules: Take 2 capsules by mouth two  times a day    . OVER THE COUNTER MEDICATION Moringa capsules: Take 2 capsules by mouth two times a day    . POTASSIUM CHLORIDE PO Take 1,000 mg by mouth daily.     No facility-administered medications prior to visit.      Allergies:   Gluten meal; Wheat bran; Mold extract [trichophyton]; Neosporin [neomycin-bacitracin zn-polymyx]; Other; Petroleum jelly lip treatment [dimethicone]; and Tape   Social History   Social History  . Marital status: Unknown    Spouse name: N/A  . Number of children: N/A  . Years of education: N/A   Social History Main Topics  . Smoking status:  Former Smoker    Quit date: 01/29/1991  . Smokeless tobacco: Never Used  . Alcohol use No  . Drug use: No  . Sexual activity: Not on file   Other Topics Concern  . Not on file   Social History Narrative  . No narrative on file     Family History:  The patient's family history includes Hypertension in her mother.   ROS:   Please see the history of present illness.    Review of Systems  Constitution: Negative.  HENT: Negative.   Eyes: Positive for visual disturbance.  Cardiovascular: Positive for dyspnea on exertion.  Respiratory: Negative.   Hematologic/Lymphatic: Negative.   Musculoskeletal: Negative.  Negative for joint pain.  Gastrointestinal: Negative.   Genitourinary: Negative.   Neurological: Negative.    All other systems reviewed and are negative.   PHYSICAL EXAM:   VS:  BP 124/64   Pulse 91   Ht 5\' 7"  (1.702 m)   Wt 281 lb (127.5 kg)   BMI 44.01 kg/m   Physical Exam  GEN: Obese, in no acute distress  Neck: no JVD, carotid bruits, or masses Cardiac irregular irregular, distant heart sounds; no murmurs, rubs, or gallops  Respiratory:  clear to auscultation bilaterally, normal work of breathing GI: soft, nontender, nondistended, + BS Ext: +2 edema bilaterally without cyanosis, clubbing,  Good distal pulses bilaterally Psych: euthymic mood, full affect  Wt Readings from Last 3 Encounters:  02/19/16 281 lb (127.5 kg)  02/04/16 (!) 313 lb 1.6 oz (142 kg)      Studies/Labs Reviewed:   EKG:  EKG is  ordered today.  The ekg ordered today demonstrates Atrial flutter at 91 bpm  Recent Labs: 01/29/2016: ALT 27; B Natriuretic Peptide 513.8; TSH 2.363 02/04/2016: BUN 22; Creatinine, Ser 0.82; Hemoglobin 15.0; Platelets 214; Potassium 3.4; Sodium 139   Lipid Panel    Component Value Date/Time   CHOL 107 01/30/2016 0229   TRIG 61 01/30/2016 0229   HDL 41 01/30/2016 0229   CHOLHDL 2.6 01/30/2016 0229   VLDL 12 01/30/2016 0229   LDLCALC 54 01/30/2016 0229     Additional studies/ records that were reviewed today include:  Transthoracic Echocardiography Study Conclusions   - Left ventricle: The cavity size was normal. Wall thickness was   normal. Systolic function was severely reduced. The estimated   ejection fraction was in the range of 25% to 30%. Diffuse   hypokinesis. - Ventricular septum: The contour showed diastolic flattening. - Mitral valve: There was moderate regurgitation directed   posteriorly. - Left atrium: The atrium was moderately dilated. - Right ventricle: The cavity size was mildly dilated. Wall   thickness was normal. Systolic function was severely reduced. - Right atrium: The atrium was moderately dilated. - Tricuspid valve: There was moderate regurgitation.       ASSESSMENT:  1. Pre-procedure lab exam   2. Atrial fibrillation with RVR (HCC)   3. Cardiomyopathy, dilated (HCC)   4. Acute systolic CHF (congestive heart failure) (HCC)   5. Morbid obesity with BMI of 45.0-49.9, adult (HCC)      PLAN:  In order of problems listed above:  Preop lab exam prior to cardioversion  Atrial fibrillation/flutter with rate of 91 bpm. On amiodarone 400 mg daily. Will have a full load next week. Also on Eliqui. Hasn't missed any doses. She had TEE/DC CV in the hospital and converted to normal sinus rhythm but reverted back to atrial fibrillation. Discussed with Dr. Clifton James in detail.Plan is to cardiovert next week in hopes that her LV function will normalize. Follow-up with Dr.Camnitz after.  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 Hopefully this will normalize when she converts to normal sinus rhythm. Will need follow-up 2-D echo.   Acute systolic CHF: Patient still has some edema so we'll continue Lasix 40 mg twice a day. Check renal function today.  Morbid obesity will need to begin a weight loss program.      Medication Adjustments/Labs and Tests Ordered: Current medicines are reviewed  at length with the patient today.  Concerns regarding medicines are outlined above.  Medication changes, Labs and Tests ordered today are listed in the Patient Instructions below. Patient Instructions  Medication Instructions:  Your physician recommends that you continue on your current medications as directed. Please refer to the Current Medication list given to you today.  Labwork: Your physician recommends that you have lab work today- BMET, CBC, and PT/INR  Testing/Procedures: Your physician has recommended that you have a Cardioversion (DCCV). Electrical Cardioversion uses a jolt of electricity to your heart either through paddles or wired patches attached to your chest. This is a controlled, usually prescheduled, procedure. Defibrillation is done under light anesthesia in the hospital, and you usually go home the day of the procedure. This is done to get your heart back into a normal rhythm. You are not awake for the procedure. Please see the instruction sheet given to you today.  Follow-Up: Your physician wants you to follow-up in: 3 weeks with Dr. Elberta Fortis.  If you need a refill on your cardiac medications before your next appointment, please call your pharmacy.       Elson Clan, PA-C  02/19/2016 2:59 PM    [facility] [address] [place], [place], [place]  [address] Phone: [phone]; Fax: [phone]

## 2016-02-26 NOTE — Anesthesia Postprocedure Evaluation (Signed)
Anesthesia Post Note  Patient: Briana Kane  Procedure(s) Performed: Procedure(s) (LRB): CARDIOVERSION (N/A)  Patient location during evaluation: PACU Anesthesia Type: General Level of consciousness: awake Pain management: pain level controlled Vital Signs Assessment: post-procedure vital signs reviewed and stable Respiratory status: spontaneous breathing Cardiovascular status: stable Anesthetic complications: no       Last Vitals:  Vitals:   02/26/16 1320 02/26/16 1330  BP: (!) 141/95 (!) 145/104  Pulse: 97 94  Resp: 14 20  Temp: 36.6 C     Last Pain:  Vitals:   02/26/16 1320  TempSrc: Oral                 [name]

## 2016-02-26 NOTE — Interval H&P Note (Signed)
History and Physical Interval Note:  02/26/2016 12:03 PM  Briana Kane  has presented today for surgery, with the diagnosis of afib  The various methods of treatment have been discussed with the patient and family. After consideration of risks, benefits and other options for treatment, the patient has consented to  Procedure(s): CARDIOVERSION (N/A) as a surgical intervention .  The patient's history has been reviewed, patient examined, no change in status, stable for surgery.  I have reviewed the patient's chart and labs.  Questions were answered to the patient's satisfaction.     [employer]

## 2016-02-26 NOTE — Telephone Encounter (Signed)
No, I don't believe you are developing polycythemia. Your platelets are normal and your hemoglobin is only mildly elevated and has been similar on all blood work that we've done.

## 2016-02-26 NOTE — Discharge Instructions (Signed)
Electrical Cardioversion, Care After °This sheet gives you information about how to care for yourself after your procedure. Your health care provider may also give you more specific instructions. If you have problems or questions, contact your health care provider. °What can I expect after the procedure? °After the procedure, it is common to have: °· Some redness on the skin where the shocks were given. °Follow these instructions at home: °· Do not drive for 24 hours if you were given a medicine to help you relax (sedative). °· Take over-the-counter and prescription medicines only as told by your health care provider. °· Ask your health care provider how to check your pulse. Check it often. °· Rest for 48 hours after the procedure or as told by your health care provider. °· Avoid or limit your caffeine use as told by your health care provider. °Contact a health care provider if: °· You feel like your heart is beating too quickly or your pulse is not regular. °· You have a serious muscle cramp that does not go away. °Get help right away if: °· You have discomfort in your chest. °· You are dizzy or you feel faint. °· You have trouble breathing or you are short of breath. °· Your speech is slurred. °· You have trouble moving an arm or leg on one side of your body. °· Your fingers or toes turn cold or blue. °This information is not intended to replace advice given to you by your health care provider. Make sure you discuss any questions you have with your health care provider. °Document Released: 10/28/2012 Document Revised: 08/11/2015 Document Reviewed: 07/14/2015 °Elsevier Interactive Patient Education © 2017 Elsevier Inc. ° °

## 2016-02-26 NOTE — Transfer of Care (Signed)
Immediate Anesthesia Transfer of Care Note  Patient: Briana Kane  Procedure(s) Performed: Procedure(s): CARDIOVERSION (N/A)  Patient Location: Endoscopy Unit  Anesthesia Type:General  Level of Consciousness: awake, alert  and oriented  Airway & Oxygen Therapy: Patient Spontanous Breathing  Post-op Assessment: Report given to RN and Post -op Vital signs reviewed and stable  Post vital signs: Reviewed and stable  Last Vitals:  Vitals:   02/26/16 1135  BP: (!) 144/103  Pulse: 100  Resp: 17  Temp: 36.5 C    Last Pain:  Vitals:   02/26/16 1135  TempSrc: Oral         Complications: No apparent anesthesia complications

## 2016-02-26 NOTE — CV Procedure (Signed)
    Electrical Cardioversion Procedure Note Briana Kane [id number] 1950-03-11  Procedure: Electrical Cardioversion Indications:  Atrial Fibrillation  Time Out: Verified patient identification, verified procedure,medications/allergies/relevent history reviewed, required imaging and test results available.  Performed  Procedure Details  The patient was NPO after midnight. Anesthesia was administered at the beside  by Dr.Green with propofol.  Cardioversion was performed with synchronized biphasic defibrillation via AP pads with 120, 200, 200 joules.  3 attempt(s) were performed.  The patient DID NOT CONVERT to normal sinus rhythm. The patient tolerated the procedure well   IMPRESSION:  Unsuccessful cardioversion of atrial fibrillation on Amiodarone.    Briana Kane 02/26/2016, 1:15 PM

## 2016-02-26 NOTE — Anesthesia Preprocedure Evaluation (Signed)
Anesthesia Evaluation  Patient identified by MRN, date of birth, ID band Patient awake    Reviewed: Allergy & Precautions, NPO status , Patient's Chart, lab work & pertinent test results  Airway Mallampati: II  TM Distance: >3 FB     Dental   Pulmonary shortness of breath, pneumonia, former smoker,    breath sounds clear to auscultation       Cardiovascular +CHF   Rhythm:Regular Rate:Normal     Neuro/Psych    GI/Hepatic negative GI ROS, Neg liver ROS,   Endo/Other  negative endocrine ROS  Renal/GU      Musculoskeletal   Abdominal   Peds  Hematology   Anesthesia Other Findings   Reproductive/Obstetrics                             Anesthesia Physical Anesthesia Plan  ASA: III  Anesthesia Plan: General   Post-op Pain Management:    Induction: Intravenous  Airway Management Planned: Oral ETT  Additional Equipment: Arterial line  Intra-op Plan:   Post-operative Plan:   Informed Consent: I have reviewed the patients History and Physical, chart, labs and discussed the procedure including the risks, benefits and alternatives for the proposed anesthesia with the patient or authorized representative who has indicated his/her understanding and acceptance.   Dental advisory given  Plan Discussed with: CRNA and Anesthesiologist  Anesthesia Plan Comments:         Anesthesia Quick Evaluation

## 2016-02-26 NOTE — Anesthesia Postprocedure Evaluation (Signed)
Anesthesia Post Note  Patient: Briana Kane  Procedure(s) Performed: Procedure(s) (LRB): CARDIOVERSION (N/A)  Patient location during evaluation: PACU Anesthesia Type: General Level of consciousness: awake Pain management: pain level controlled Vital Signs Assessment: post-procedure vital signs reviewed and stable Cardiovascular status: stable Anesthetic complications: no       Last Vitals:  Vitals:   02/26/16 1320 02/26/16 1330  BP: (!) 141/95 (!) 145/104  Pulse: 97 94  Resp: 14 20  Temp: 36.6 C     Last Pain:  Vitals:   02/26/16 1320  TempSrc: Oral                 Shauntea Lok

## 2016-02-27 ENCOUNTER — Encounter (HOSPITAL_COMMUNITY): Payer: Self-pay | Admitting: Cardiology

## 2016-03-13 ENCOUNTER — Encounter: Payer: Self-pay | Admitting: Cardiology

## 2016-03-22 ENCOUNTER — Ambulatory Visit (INDEPENDENT_AMBULATORY_CARE_PROVIDER_SITE_OTHER): Payer: BLUE CROSS/BLUE SHIELD | Admitting: Cardiology

## 2016-03-22 ENCOUNTER — Encounter: Payer: Self-pay | Admitting: Cardiology

## 2016-03-22 VITALS — BP 130/82 | HR 53 | Ht 67.5 in | Wt 273.0 lb

## 2016-03-22 DIAGNOSIS — I481 Persistent atrial fibrillation: Secondary | ICD-10-CM

## 2016-03-22 DIAGNOSIS — I420 Dilated cardiomyopathy: Secondary | ICD-10-CM

## 2016-03-22 DIAGNOSIS — I4819 Other persistent atrial fibrillation: Secondary | ICD-10-CM

## 2016-03-22 MED ORDER — SACUBITRIL-VALSARTAN 24-26 MG PO TABS: 1.0000 | ORAL_TABLET | Freq: Two times a day (BID) | ORAL | 6 refills | Status: DC

## 2016-03-22 NOTE — Patient Instructions (Addendum)
Medication Instructions:    Your physician has recommended you make the following change in your medication:  1) STOP Lisinopril 2) START Entresto 24/26 mg twice a day.  Start this medication on Sunday 03/24/16  --- If you need a refill on your cardiac medications before your next appointment, please call your pharmacy. ---  Labwork:  None ordered  Testing/Procedures: Your physician has requested that you have an echocardiogram in 3 months, prior to seeing Dr. Elberta Fortis. Echocardiography is a painless test that uses sound waves to create images of your heart. It provides your doctor with information about the size and shape of your heart and how well your heart's chambers and valves are working. This procedure takes approximately one hour. There are no restrictions for this procedure.  Follow-Up:  Your physician recommends that you schedule a follow-up appointment in: 1-2 weeks with pharmacist for Entresto titration.   Your physician recommends that you schedule a follow-up appointment in: 3 months with Dr. Elberta Fortis.  (please have echocardiogram completed before this appt)  Thank you for choosing [facility]!!   Dory Horn, RN [phone]  Any Other Special Instructions Will Be Listed Below (If Applicable).  Sacubitril; Valsartan oral tablet What is this medicine? SACUBITRIL; VALSARTAN (sak UE bi tril; val SAR tan) is a combination of 2 drugs used to reduce the risk of death and hospitalizations in people with long-lasting heart failure. It is usually used with other medicines to treat heart failure. This medicine may be used for other purposes; ask your health care provider or pharmacist if you have questions. COMMON BRAND NAME(S): Entresto What should I tell my health care provider before I take this medicine? They need to know if you have any of these conditions: -diabetes and take a medicine that contains aliskiren -kidney disease -liver disease -an unusual or allergic  reaction to sacubitril; valsartan, drugs called angiotensin converting enzyme (ACE) inhibitors, angiotensin II receptor blockers (ARBs), other medicines, foods, dyes, or preservatives -pregnant or trying to get pregnant -breast-feeding How should I use this medicine? Take this medicine by mouth with a glass of water. Follow the directions on the prescription label. You can take it with or without food. If it upsets your stomach, take it with food. Take your medicine at regular intervals. Do not take it more often than directed. Do not stop taking except on your doctor's advice. Do not take this medicine for at least 36 hours before or after you take an ACE inhibitor medicine. Talk to your health care provider if you are not sure if you take an ACE inhibitor. Talk to your pediatrician regarding the use of this medicine in children. Special care may be needed. Overdosage: If you think you have taken too much of this medicine contact a poison control center or emergency room at once. NOTE: This medicine is only for you. Do not share this medicine with others. What if I miss a dose? If you miss a dose, take it as soon as you can. If it is almost time for next dose, take only that dose. Do not take double or extra doses. What may interact with this medicine? Do not take this medicine with any of the following medicines: -aliskiren if you have diabetes -angiotensin-converting enzyme (ACE) inhibitors, like benazepril, captopril, enalapril, fosinopril, lisinopril, or ramipril This medicine may also interact with the following medicines: -angiotensin II receptor blockers (ARBs) like azilsartan, candesartan, eprosartan, irbesartan, losartan, olmesartan, telmisartan, or valsartan -lithium -NSAIDS, medicines for pain and inflammation, like  ibuprofen or naproxen -potassium-sparing diuretics like amiloride, spironolactone, and triamterene -potassium supplements This list may not describe all possible  interactions. Give your health care provider a list of all the medicines, herbs, non-prescription drugs, or dietary supplements you use. Also tell them if you smoke, drink alcohol, or use illegal drugs. Some items may interact with your medicine. What should I watch for while using this medicine? Tell your doctor or healthcare professional if your symptoms do not start to get better or if they get worse. Do not become pregnant while taking this medicine. Women should inform their doctor if they wish to become pregnant or think they might be pregnant. There is a potential for serious side effects to an unborn child. Talk to your health care professional or pharmacist for more information. You may get dizzy. Do not drive, use machinery, or do anything that needs mental alertness until you know how this medicine affects you. Do not stand or sit up quickly, especially if you are an older patient. This reduces the risk of dizzy or fainting spells. Avoid alcoholic drinks; they can make you more dizzy. What side effects may I notice from receiving this medicine? Side effects that you should report to your doctor or health care professional as soon as possible: -allergic reactions like skin rash, itching or hives, swelling of the face, lips, or tongue -signs and symptoms of increased potassium like muscle weakness; chest pain; or fast, irregular heartbeat -signs and symptoms of kidney injury like trouble passing urine or change in the amount of urine -signs and symptoms of low blood pressure like feeling dizzy or lightheaded, or if you develop extreme fatigue Side effects that usually do not require medical attention (report to your doctor or health care professional if they continue or are bothersome): -cough This list may not describe all possible side effects. Call your doctor for medical advice about side effects. You may report side effects to FDA at 1-800-FDA-1088. Where should I keep my medicine? Keep  out of the reach of children. Store at room temperature between 15 and 30 degrees C (59 and 86 degrees F). Throw away any unused medicine after the expiration date. NOTE: This sheet is a summary. It may not cover all possible information. If you have questions about this medicine, talk to your doctor, pharmacist, or health care provider.  2018 Elsevier/Gold Standard (2015-02-22 13:54:19)

## 2016-03-22 NOTE — Progress Notes (Signed)
Electrophysiology Office Note   Date:  03/22/2016   ID:  Briana Kane, DOB 02-10-1950, MRN [id number]  PCP:  Pcp Not In System  Cardiologist:  [place] Primary Electrophysiologist:  Briana Newmann [name], MD    Chief Complaint  Patient presents with  . Follow-up    Presistent Afib     History of Present Illness: Briana Kane is a 66 y.o. female who presents today for electrophysiology evaluation.   Has a history of atrial fibrillation with rapid rates, morbid obesity, systolic heart failure.   Today, she denies symptoms of palpitations, chest pain, shortness of breath, orthopnea, PND, lower extremity edema, claudication, dizziness, presyncope, syncope, bleeding, or neurologic sequela. The patient is tolerating medications without difficulties and is otherwise without complaint today.    Past Medical History:  Diagnosis Date  . Acute systolic CHF (congestive heart failure) (HCC) 02/02/2016  . Atrial fibrillation with RVR (HCC) 01/29/2016  . Bilateral leg edema 01/29/2016  . Community acquired pneumonia 01/29/2016  . Dyspnea   . Generalized edema   . Morbid obesity with BMI of 45.0-49.9, adult (HCC) 02/02/2016   Past Surgical History:  Procedure Laterality Date  . CARDIOVERSION N/A 02/01/2016   Procedure: CARDIOVERSION;  Surgeon: [name], MD;  Location: [facility] ENDOSCOPY;  Service: Cardiovascular;  Laterality: N/A;  . CARDIOVERSION N/A 02/26/2016   Procedure: CARDIOVERSION;  Surgeon: Jake Bathe, MD;  Location: [facility] ENDOSCOPY;  Service: Cardiovascular;  Laterality: N/A;  . TEE WITHOUT CARDIOVERSION N/A 02/01/2016   Procedure: TRANSESOPHAGEAL ECHOCARDIOGRAM (TEE);  Surgeon: [name], MD;  Location: [facility] ENDOSCOPY;  Service: Cardiovascular;  Laterality: N/A;     Current Outpatient Prescriptions  Medication Sig Dispense Refill  . ALPRAZolam (XANAX) 0.5 MG tablet Take 1 tablet (0.5 mg total) by mouth 3 (three) times daily as needed for anxiety. 20 tablet 0  .  amiodarone (PACERONE) 200 MG tablet Take 1 tablet (200 mg total) by mouth 2 (two) times daily. 60 tablet 2  . apixaban (ELIQUIS) 5 MG TABS tablet Take 1 tablet (5 mg total) by mouth 2 (two) times daily. 60 tablet 2  . B Complex Vitamins (B COMPLEX PO) Take 1 tablet by mouth 2 (two) times daily.    Marland Kitchen COENZYME Q-10 PO Take 1 capsule by mouth 4 (four) times daily.    . furosemide (LASIX) 40 MG tablet Take 1 tablet (40 mg total) by mouth 2 (two) times daily. 60 tablet 2  . HAWTHORNE BERRY PO Take 1 capsule by mouth 2 (two) times daily.    Marland Kitchen MAGNESIUM PO Take 1,000 mg by mouth daily.    . metoprolol (LOPRESSOR) 50 MG tablet Take 1 tablet (50 mg total) by mouth 2 (two) times daily. 60 tablet 2  . Misc Natural Products (TURMERIC CURCUMIN) CAPS Take 2 capsules by mouth 2 (two) times daily.    Marland Kitchen NIACIN PO Take 2 tablets by mouth 2 (two) times daily.    . NON FORMULARY Take 2 capsules by mouth daily. Taurine 1,000 mg    . OVER THE COUNTER MEDICATION Beet Root: Take 1 capsule by mouth four times a day    . OVER THE COUNTER MEDICATION Ashwaganda capsules: Take 2 capsules by mouth two times a day    . OVER THE COUNTER MEDICATION Moringa capsules: Take 2 capsules by mouth two times a day    . POTASSIUM CHLORIDE PO Take 1,000 mg by mouth daily.    Melene Muller ON 03/24/2016] sacubitril-valsartan (ENTRESTO) 24-26 MG Take 1  tablet by mouth 2 (two) times daily. 60 tablet 6   No current facility-administered medications for this visit.     Allergies:   Gluten meal; Wheat bran; Mold extract [trichophyton]; Neosporin [neomycin-bacitracin zn-polymyx]; Other; Petroleum jelly lip treatment [dimethicone]; and Tape   Social History:  The patient  reports that she quit smoking about 25 years ago. She has never used smokeless tobacco. She reports that she does not drink alcohol or use drugs.   Family History:  The patient's family history includes Hypertension in her mother.    ROS:  Please see the history of present  illness.   Otherwise, review of systems is positive for none.   All other systems are reviewed and negative.    PHYSICAL EXAM: VS:  BP 130/82   Pulse (!) 53   Ht 5' 7.5" (1.715 m)   Wt 273 lb (123.8 kg)   BMI 42.13 kg/m  , BMI Body mass index is 42.13 kg/m. GEN: Well nourished, well developed, in no acute distress  HEENT: normal  Neck: no JVD, carotid bruits, or masses Cardiac: RRR; no murmurs, rubs, or gallops,no edema  Respiratory:  clear to auscultation bilaterally, normal work of breathing GI: soft, nontender, nondistended, + BS MS: no deformity or atrophy  Skin: warm and dry Neuro:  Strength and sensation are intact Psych: euthymic mood, full affect  EKG:  EKG is ordered today. Personal review of the ekg ordered shows sinus rhythm, rate 53, iRBBB, PRWP  Recent Labs: 01/29/2016: ALT 27; B Natriuretic Peptide 513.8; TSH 2.363 02/04/2016: Hemoglobin 15.0 02/19/2016: BUN 23; Creatinine, Ser 0.94; Platelets 245; Potassium 4.4; Sodium 144    Lipid Panel     Component Value Date/Time   CHOL 107 01/30/2016 0229   TRIG 61 01/30/2016 0229   HDL 41 01/30/2016 0229   CHOLHDL 2.6 01/30/2016 0229   VLDL 12 01/30/2016 0229   LDLCALC 54 01/30/2016 0229     Wt Readings from Last 3 Encounters:  03/22/16 273 lb (123.8 kg)  02/19/16 281 lb (127.5 kg)  02/04/16 (!) 313 lb 1.6 oz (142 kg)      Other studies Reviewed: Additional studies/ records that were reviewed today include: TTE 01/31/16  Review of the above records today demonstrates:  - Left ventricle: The cavity size was normal. Wall thickness was   normal. Systolic function was severely reduced. The estimated   ejection fraction was in the range of 25% to 30%. Diffuse   hypokinesis. - Ventricular septum: The contour showed diastolic flattening. - Mitral valve: There was moderate regurgitation directed   posteriorly. - Left atrium: The atrium was moderately dilated. - Right ventricle: The cavity size was mildly dilated.  Wall   thickness was normal. Systolic function was severely reduced. - Right atrium: The atrium was moderately dilated. - Tricuspid valve: There was moderate regurgitation.   ASSESSMENT AND PLAN:  1.  Atrial fibrillation with RVR: Currently on Eliquis and amiodarone. In sinus rhythm today and feeling well. No changes today.  This patients CHA2DS2-VASc Score and unadjusted Ischemic Stroke Rate (% per year) is equal to 2.2 % stroke rate/year from a score of 2  Above score calculated as 1 point each if present [CHF, HTN, DM, Vascular=MI/PAD/Aortic Plaque, Age if 65-74, or Female] Above score calculated as 2 points each if present [Age > 75, or Stroke/TIA/TE]  2. Systolic heart failure: Showed a low ejection fraction 25-30%. Hospital she was quite tachycardic. 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 We'll continue her  amiodarone, as she has reverted back to sinus rhythm. I Icel Castles bring her back in 3 months with an echo prior to that to see if her ejection fraction is improved. We'll also refer her to our pharmacists for discussion on switching from lisinopril to [place].  3. Mitral regurgitation: Currently asymptomatic. Continue heart failure therapy and monitoring.  4. Obesity: Weight loss and exercise encouraged  Current medicines are reviewed at length with the patient today.   The patient does not have concerns regarding her medicines.  The following changes were made today:  Referral for Entresto  Labs/ tests ordered today include:  Orders Placed This Encounter  Procedures  . EKG 12-Lead  . ECHOCARDIOGRAM COMPLETE     Disposition:   FU with Briana Kane 3 months  Signed, Shourya Macpherson [name], MD  03/22/2016 4:35 PM     [facility] [address] [place] [address] [phone] (office) [phone] (fax)

## 2016-03-28 ENCOUNTER — Telehealth: Payer: Self-pay | Admitting: *Deleted

## 2016-03-28 NOTE — Telephone Encounter (Signed)
Sent PA for [employer]

## 2016-04-01 NOTE — Telephone Encounter (Signed)
Entresto approved from 03/28/2016 - 01/20/2038. Pt picked up medication on 03/30/16.  lmtcb to determine start date and if pharmacist f/u appt Wednesday is appropriate.

## 2016-04-03 ENCOUNTER — Ambulatory Visit (INDEPENDENT_AMBULATORY_CARE_PROVIDER_SITE_OTHER): Payer: BLUE CROSS/BLUE SHIELD | Admitting: Pharmacist

## 2016-04-03 VITALS — BP 138/92 | HR 47

## 2016-04-03 DIAGNOSIS — I5021 Acute systolic (congestive) heart failure: Secondary | ICD-10-CM | POA: Diagnosis not present

## 2016-04-03 LAB — BASIC METABOLIC PANEL
BUN / CREAT RATIO: 32 — AB (ref 12–28)
BUN: 29 mg/dL — ABNORMAL HIGH (ref 8–27)
CO2: 23 mmol/L (ref 18–29)
CREATININE: 0.92 mg/dL (ref 0.57–1.00)
Calcium: 9.5 mg/dL (ref 8.7–10.3)
Chloride: 98 mmol/L (ref 96–106)
GFR, EST AFRICAN AMERICAN: 76 mL/min/{1.73_m2} (ref >59)
GFR, EST NON AFRICAN AMERICAN: 66 mL/min/{1.73_m2} (ref >59)
GLUCOSE: 99 mg/dL (ref 65–99)
Potassium: 4.5 mmol/L (ref 3.5–5.2)
SODIUM: 140 mmol/L (ref 134–144)

## 2016-04-03 NOTE — Patient Instructions (Addendum)
Return for a a follow up appointment in 2 weeks   Your blood pressure today is 138/92  Check your blood pressure at home daily (if able) and keep record of the readings.  Take your BP meds as follows: Pending our phone call tomorrow - increase Entresto to 49/51 mg twice daily.  CONTINUE metoprolol 50 mg twice daily and furosemide 40 mg twice daily.   Bring all of your meds, your BP cuff and your record of home blood pressures to your next appointment.  Exercise as you're able, try to walk approximately 30 minutes per day.  Keep salt intake to a minimum, especially watch canned and prepared boxed foods.  Eat more fresh fruits and vegetables and fewer canned items.  Avoid eating in fast food restaurants.    HOW TO TAKE YOUR BLOOD PRESSURE: . Rest 5 minutes before taking your blood pressure. .  Don't smoke or drink caffeinated beverages for at least 30 minutes before. . Take your blood pressure before (not after) you eat. . Sit comfortably with your back supported and both feet on the floor (don't cross your legs). . Elevate your arm to heart level on a table or a desk. . Use the proper sized cuff. It should fit smoothly and snugly around your bare upper arm. There should be enough room to slip a fingertip under the cuff. The bottom edge of the cuff should be 1 inch above the crease of the elbow. . Ideally, take 3 measurements at one sitting and record the average.

## 2016-04-03 NOTE — Progress Notes (Signed)
Patient ID: Briana Kane                 DOB: [birth year]                      MRN: [id number]     HPI: Briana Kane is a 66 y.o. female patient of Dr. Clifton James, referred by Dr. Elberta Fortis for medication titration. PMH inlcudes afib with rapid rates and systolic heart failure. She was admitted in Feb 2018 for cardioversion. Most recent Echo showed EF of 25-30%. At her most recent visit with Dr. Elberta Fortis about 2 weeks ago her lisinopril was stopped and after a 36 hour wash out period she was started on Entresto 24/26mg  BID.   Patient presents today with a very negative attitude towards the office and healthcare in general. She seems to have significant stressors in her life, including difficult family relationships and a recent diagnosis of cancer in her mother. She has never taken any prescription medications until this recent hospitalization, and is still very uncomfortable and unhappy with taking medications.   When she filled her Entresto 24/26 mg BID at the pharmacy, she was given two bottles, and has been taking two of the 24/26 tablets BID. On Sunday, she realized this wasn't the dose she was supposed to be on, and went back to taking one 24/26 mg BID. She expressed major concerns with her copays of Entresto and Eliquis, and her worries with starting Medicare next year.   Patient expresses some complaints about dehydration and thirst.  Current HTN meds:  Entresto 24-26mg  BID-> two twice daily; Metoprolol tartrate 50mg  BID Furosemide 40mg  BID  Family History: Mother with HTN.   Social History: Quit smoking 25 years ago. Never smokeless tobacco user. Denies alcohol.    Wt Readings from Last 3 Encounters:  03/22/16 273 lb (123.8 kg)  02/19/16 281 lb (127.5 kg)  02/04/16 (!) 313 lb 1.6 oz (142 kg)   BP Readings from Last 3 Encounters:  04/03/16 (!) 138/92  03/22/16 130/82  02/26/16 (!) 145/104   Pulse Readings from Last 3 Encounters:  04/03/16 (!) 47  03/22/16 (!) 53    02/26/16 94    Renal function: CrCl cannot be calculated (Patient's most recent lab result is older than the maximum 21 days allowed.).  Past Medical History:  Diagnosis Date  . Acute systolic CHF (congestive heart failure) (HCC) 02/02/2016  . Atrial fibrillation with RVR (HCC) 01/29/2016  . Bilateral leg edema 01/29/2016  . Community acquired pneumonia 01/29/2016  . Dyspnea   . Generalized edema   . Morbid obesity with BMI of 45.0-49.9, adult (HCC) 02/02/2016    Current Outpatient Prescriptions on File Prior to Visit  Medication Sig Dispense Refill  . ALPRAZolam (XANAX) 0.5 MG tablet Take 1 tablet (0.5 mg total) by mouth 3 (three) times daily as needed for anxiety. 20 tablet 0  . amiodarone (PACERONE) 200 MG tablet Take 1 tablet (200 mg total) by mouth 2 (two) times daily. 60 tablet 2  . apixaban (ELIQUIS) 5 MG TABS tablet Take 1 tablet (5 mg total) by mouth 2 (two) times daily. 60 tablet 2  . B Complex Vitamins (B COMPLEX PO) Take 1 tablet by mouth 2 (two) times daily.    Marland Kitchen COENZYME Q-10 PO Take 1 capsule by mouth 4 (four) times daily.    . furosemide (LASIX) 40 MG tablet Take 1 tablet (40 mg total) by mouth 2 (two) times daily. 60 tablet 2  .  HAWTHORNE BERRY PO Take 1 capsule by mouth 2 (two) times daily.    Marland Kitchen MAGNESIUM PO Take 1,000 mg by mouth daily.    . metoprolol (LOPRESSOR) 50 MG tablet Take 1 tablet (50 mg total) by mouth 2 (two) times daily. 60 tablet 2  . Misc Natural Products (TURMERIC CURCUMIN) CAPS Take 2 capsules by mouth 2 (two) times daily.    Marland Kitchen NIACIN PO Take 2 tablets by mouth 2 (two) times daily.    . NON FORMULARY Take 2 capsules by mouth daily. Taurine 1,000 mg    . OVER THE COUNTER MEDICATION Beet Root: Take 1 capsule by mouth four times a day    . OVER THE COUNTER MEDICATION Ashwaganda capsules: Take 2 capsules by mouth two times a day    . OVER THE COUNTER MEDICATION Moringa capsules: Take 2 capsules by mouth two times a day    . POTASSIUM CHLORIDE PO Take 1,000  mg by mouth daily.    . sacubitril-valsartan (ENTRESTO) 24-26 MG Take 1 tablet by mouth 2 (two) times daily. 60 tablet 6   No current facility-administered medications on file prior to visit.     Allergies  Allergen Reactions  . Gluten Meal Shortness Of Breath and Other (See Comments)    Triggered fluid around her heart a couple of years ago (NO BREAD OR PASTA)  . Wheat Bran Shortness Of Breath    Also, possible edema (around heart)  . Mold Extract [Trichophyton] Other (See Comments)    Causes congestion (sudden)  . Neosporin [Neomycin-Bacitracin Zn-Polymyx] Rash  . Other Rash    Neoprene (NO!)  . Petroleum Jelly Lip Treatment [Dimethicone] Rash  . Tape Rash    Blood pressure (!) 138/92, pulse (!) 47, SpO2 98 %.   Assessment/Plan: PENDING BMET Hypertension: Patient's blood pressure has room to increase the Entresto dose. 1. Increase Entresto to 49/51 mg BID. She was given samples to take until her next visit. BMET renal function and potassium were normal.  2. Metoprolol succinate is preferred in heart failure, rather than the metoprolol tartrate that she is currently taking. She has a large supply of the tartrate at home, so we will continue that for the time being. At follow up appointment, we will discuss switching to succinate. With her low HR at 47 today, we might reduce her metoprolol dose at that time.  2. SCr/BUN ratio suggests some dehydration. Decrease furosemide to 40 mg once daily in the morning. Patient was counseled to watch for shortness of breath and weight gain (3 lbs in 1 day, 5 lbs in a week), and to call clinic if issues. 3. Follow up in 2 weeks for tolerability, blood pressure, and BMET for renal function and potassium.  Patient notes that she sometimes takes a potassium supplement. She has not had any for the last week. At next appointment, we will discuss whether she has been taking the potassium, whether she wants to continue, and how her potassium looks from the  BMET.  4. She was provided with Eliquis and Entresto coupon cards. Patient assistance programs can be pursued when she starts Medicare.   Patient seen with Caroline More, PharmD Candidate 2018.  Thank you, Freddie Apley. Cleatis Polka, PharmD  [facility]  04/03/2016 11:24 AM

## 2016-04-12 ENCOUNTER — Encounter: Payer: Self-pay | Admitting: Cardiology

## 2016-04-18 ENCOUNTER — Ambulatory Visit: Payer: BLUE CROSS/BLUE SHIELD | Admitting: Cardiology

## 2016-04-24 ENCOUNTER — Other Ambulatory Visit: Payer: BLUE CROSS/BLUE SHIELD | Admitting: *Deleted

## 2016-04-24 ENCOUNTER — Ambulatory Visit (INDEPENDENT_AMBULATORY_CARE_PROVIDER_SITE_OTHER): Payer: BLUE CROSS/BLUE SHIELD | Admitting: Pharmacist

## 2016-04-24 VITALS — BP 144/92 | HR 45

## 2016-04-24 DIAGNOSIS — I5021 Acute systolic (congestive) heart failure: Secondary | ICD-10-CM

## 2016-04-24 LAB — BASIC METABOLIC PANEL
BUN/Creatinine Ratio: 27 (ref 12–28)
BUN: 26 mg/dL (ref 8–27)
CHLORIDE: 96 mmol/L (ref 96–106)
CO2: 26 mmol/L (ref 18–29)
CREATININE: 0.97 mg/dL (ref 0.57–1.00)
Calcium: 9.3 mg/dL (ref 8.7–10.3)
GFR calc Af Amer: 71 mL/min/{1.73_m2} (ref >59)
GFR calc non Af Amer: 62 mL/min/{1.73_m2} (ref >59)
GLUCOSE: 105 mg/dL — AB (ref 65–99)
Potassium: 4.3 mmol/L (ref 3.5–5.2)
Sodium: 139 mmol/L (ref 134–144)

## 2016-04-24 MED ORDER — CARVEDILOL 6.25 MG PO TABS: 6.2500 mg | ORAL_TABLET | Freq: Two times a day (BID) | ORAL | 11 refills | Status: DC

## 2016-04-24 MED ORDER — SACUBITRIL-VALSARTAN 97-103 MG PO TABS: 1.0000 | ORAL_TABLET | Freq: Two times a day (BID) | ORAL | 11 refills | Status: DC

## 2016-04-24 NOTE — Progress Notes (Signed)
Patient ID: Briana Kane                 DOB: [birth year]                      MRN: [id number]     HPI: Briana Kane is a 66 y.o. female patient of Dr. Clifton James, referred by Dr. Elberta Fortis for HF medication titration. PMH inlcudes afib with rapid rates and systolic heart failure. She was admitted in Feb 2018 for cardioversion. Most recent Echo 01/2016 showed EF of 25-30%. At her most recent visit, her Entresto dose was increased to 49-51mg  BID and furosemide dose was decreased from 40mg  BID to daily. She presents today for BP check and BMET.  Pt reports feeling better overall. She increased her furosemide dose back to BID because she experienced edema in her ankles on daily dosing. She has still been taking potassium 2 tablets daily but does not know what strength she is taking. She is still not pleased that she needs to take medications or go to the doctor since she prefers to take herbal supplements and views alternative medicine more positively.  She expresses concerns with her copays of Entresto and Eliquis, and her worries with starting Medicare next year. Discussed that she should continue to use her copay cards for Eliquis and Entresto and that there are cheaper alternatives available if medication costs are prohibitive next year.  Current HTN meds: Entresto 49-51mg  BID, metoprolol tartrate 50mg  BID, furosemide 40mg  BID BP goal: <130/33mmHg  Family History: Mother with HTN.   Social History: Quit smoking 25 years ago. Never smokeless tobacco user. Denies alcohol.    Wt Readings from Last 3 Encounters:  03/22/16 273 lb (123.8 kg)  02/19/16 281 lb (127.5 kg)  02/04/16 (!) 313 lb 1.6 oz (142 kg)   BP Readings from Last 3 Encounters:  04/03/16 (!) 138/92  03/22/16 130/82  02/26/16 (!) 145/104   Pulse Readings from Last 3 Encounters:  04/03/16 (!) 47  03/22/16 (!) 53  02/26/16 94    Renal function: CrCl cannot be calculated (Unknown ideal weight.).  Past Medical  History:  Diagnosis Date  . Acute systolic CHF (congestive heart failure) (HCC) 02/02/2016  . Atrial fibrillation with RVR (HCC) 01/29/2016  . Bilateral leg edema 01/29/2016  . Community acquired pneumonia 01/29/2016  . Dyspnea   . Generalized edema   . Morbid obesity with BMI of 45.0-49.9, adult (HCC) 02/02/2016    Current Outpatient Prescriptions on File Prior to Visit  Medication Sig Dispense Refill  . ALPRAZolam (XANAX) 0.5 MG tablet Take 1 tablet (0.5 mg total) by mouth 3 (three) times daily as needed for anxiety. 20 tablet 0  . amiodarone (PACERONE) 200 MG tablet Take 1 tablet (200 mg total) by mouth 2 (two) times daily. 60 tablet 2  . apixaban (ELIQUIS) 5 MG TABS tablet Take 1 tablet (5 mg total) by mouth 2 (two) times daily. 60 tablet 2  . B Complex Vitamins (B COMPLEX PO) Take 1 tablet by mouth 2 (two) times daily.    Marland Kitchen COENZYME Q-10 PO Take 1 capsule by mouth 4 (four) times daily.    . furosemide (LASIX) 40 MG tablet Take 1 tablet (40 mg total) by mouth daily. 30 tablet 2  . HAWTHORNE BERRY PO Take 1 capsule by mouth 2 (two) times daily.    Marland Kitchen MAGNESIUM PO Take 1,000 mg by mouth daily.    . metoprolol (LOPRESSOR) 50 MG tablet Take 1  tablet (50 mg total) by mouth 2 (two) times daily. 60 tablet 2  . Misc Natural Products (TURMERIC CURCUMIN) CAPS Take 2 capsules by mouth 2 (two) times daily.    Marland Kitchen NIACIN PO Take 2 tablets by mouth 2 (two) times daily.    . NON FORMULARY Take 2 capsules by mouth daily. Taurine 1,000 mg    . OVER THE COUNTER MEDICATION Beet Root: Take 1 capsule by mouth four times a day    . OVER THE COUNTER MEDICATION Ashwaganda capsules: Take 2 capsules by mouth two times a day    . OVER THE COUNTER MEDICATION [place]oringa capsules: Take 2 capsules by mouth two times a day    . POTASSIUM CHLORIDE PO Take 1,000 mg by mouth daily.    . sacubitril-valsartan (ENTRESTO) 49-51 MG Take 1 tablet by mouth 2 (two) times daily. 60 tablet    No current facility-administered medications on  file prior to visit.     Allergies  Allergen Reactions  . Gluten Meal Shortness Of Breath and Other (See Comments)    Triggered fluid around her heart a couple of years ago (NO BREAD OR PASTA)  . Wheat Bran Shortness Of Breath    Also, possible edema (around heart)  . Mold Extract [Trichophyton] Other (See Comments)    Causes congestion (sudden)  . Neosporin [Neomycin-Bacitracin Zn-Polymyx] Rash  . Other Rash    Neoprene (NO!)  . Petroleum Jelly Lip Treatment [Dimethicone] Rash  . Tape Rash     Assessment/Plan:  1. HF medication optimization - BP currently above goal <130/64mmHg and have room to optimize BP medications. Will increase Entresto to 97-103mg  BID. Pt's HR is low at 45 today and pt is also taking metoprolol tartrate which is not preferred for HF patients with reduced EF. Will d/c metoprolol tartrate 50mg  BID and switch to lower dose of carvedilol 6.25mg  BID. Pt will continue on furosemide 40mg  BID since daily dose did not control edema in her legs. Checking BMET today. Will f/u in clinic in 2 weeks for BP check and BMET. Will consider addition of spironolactone at that time.   Sajad Glander E. Harlean Regula, PharmD, CPP, BCACP [facility] [address] [place], [place] [address] Phone: [phone]; Fax: [phone] 04/24/2016 11:16 AM

## 2016-04-24 NOTE — Patient Instructions (Addendum)
Increase your Entresto to 97-103mg  twice a day  Stop taking metoprolol. Start taking carvedilol 6.25mg  twice a day  Continue taking your furosemide twice a day  We will call with your lab results by the end of the week  Follow up in clinic in 2 weeks for lab and blood pressure check

## 2016-04-25 ENCOUNTER — Encounter: Payer: Self-pay | Admitting: Physician Assistant

## 2016-04-25 MED ORDER — AMIODARONE HCL 200 MG PO TABS: 200.0000 mg | ORAL_TABLET | Freq: Every day | ORAL | 6 refills | Status: DC

## 2016-04-25 MED ORDER — FUROSEMIDE 40 MG PO TABS: 40.0000 mg | ORAL_TABLET | Freq: Every day | ORAL | 6 refills | Status: DC

## 2016-04-25 MED ORDER — APIXABAN 5 MG PO TABS: 5.0000 mg | ORAL_TABLET | Freq: Two times a day (BID) | ORAL | 6 refills | Status: DC

## 2016-04-25 NOTE — Telephone Encounter (Signed)
Called patient about refill needs.  Advised to decrease Amiodarone to once daily. Patient verbalized understanding and agreeable to plan.  Refills sent for Eliquis, Amiodarone and Lasix

## 2016-04-26 ENCOUNTER — Encounter: Payer: Self-pay | Admitting: Cardiology

## 2016-05-08 ENCOUNTER — Ambulatory Visit: Payer: BLUE CROSS/BLUE SHIELD | Admitting: Pharmacist

## 2016-05-08 ENCOUNTER — Ambulatory Visit (INDEPENDENT_AMBULATORY_CARE_PROVIDER_SITE_OTHER): Payer: BLUE CROSS/BLUE SHIELD | Admitting: *Deleted

## 2016-05-08 ENCOUNTER — Other Ambulatory Visit: Payer: BLUE CROSS/BLUE SHIELD

## 2016-05-08 ENCOUNTER — Encounter: Payer: Self-pay | Admitting: Pharmacist

## 2016-05-08 VITALS — BP 132/90 | HR 57 | Wt 282.8 lb

## 2016-05-08 VITALS — BP 132/90 | HR 51 | Ht 67.0 in | Wt 282.8 lb

## 2016-05-08 DIAGNOSIS — Z79899 Other long term (current) drug therapy: Secondary | ICD-10-CM | POA: Diagnosis not present

## 2016-05-08 DIAGNOSIS — I5021 Acute systolic (congestive) heart failure: Secondary | ICD-10-CM

## 2016-05-08 LAB — BASIC METABOLIC PANEL
BUN/Creatinine Ratio: 20 (ref 12–28)
BUN: 19 mg/dL (ref 8–27)
CALCIUM: 9.5 mg/dL (ref 8.7–10.3)
CHLORIDE: 98 mmol/L (ref 96–106)
CO2: 25 mmol/L (ref 18–29)
Creatinine, Ser: 0.96 mg/dL (ref 0.57–1.00)
GFR calc Af Amer: 72 mL/min/{1.73_m2} (ref >59)
GFR, EST NON AFRICAN AMERICAN: 63 mL/min/{1.73_m2} (ref >59)
Glucose: 98 mg/dL (ref 65–99)
POTASSIUM: 4.9 mmol/L (ref 3.5–5.2)
Sodium: 141 mmol/L (ref 134–144)

## 2016-05-08 NOTE — Progress Notes (Signed)
1.) Reason for visit: EKG  2.) Name of MD requesting visit: Dr. Elberta Fortis  3.) H&P: CHF, Atrial fibrillation  4.) ROS related to problem: Pt is taking Amiodarone 200 mg once daily.    5.) Assessment and plan per MD: 12 lead EKG done HR 50 beats/minute for medication management. EKG read by  Dr Johney Frame DOD . Sinus bradycardia with incomplete RBBB He recommends to give Dr. Estill Dooms for recommendations.

## 2016-05-08 NOTE — Progress Notes (Signed)
Patient ID: Briana Kane                 DOB: 1950-06-16                      MRN: [id number]     HPI: Briana Kane a 66 y.o.femalepatient ofDr. Clifton James, referred by Dr. Gery Pray HF medication titration.PMH inlcudes afib with rapid rates and systolic heart failure. She was admitted in Feb 2018 for cardioversion. Most recent Echo 01/2016 showed EF of 25-30%. At her most recent visit, her Entresto dose was increased to 97-103mg  BID and metoprolol tartrate was changed to carvedilol due to better data in HF patients. She previously tried lowering her furosemide 40mg  dose from BID to daily but experienced swelling on the daily dose so she self-titrated back to BID dosing. She presents today for BP check and BMET.  Pt reports feeling well overall. She reports adherence to [facility] and carvedilol. Furosemide BID dosing is controlling her edema. Her HR is much improved from mid 40s to upper 50s since switching from metoprolol to lower than equivalent dose of carvedilol at last visit. She is still not pleased that she needs to take medications or go to the doctor since she prefers to take herbal supplements and views alternative medicine more positively.  Current HTN meds:Entresto 97-103mg  BID, carvedilol 6.25mg  BID, furosemide 40mg  BID BP goal: <130/13mmHg  Family History: Mother with HTN.   Social History: Quit smoking 25 years ago. Never smokeless tobacco user. Denies alcohol.     Wt Readings from Last 3 Encounters:  03/22/16 273 lb (123.8 kg)  02/19/16 281 lb (127.5 kg)  02/04/16 (!) 313 lb 1.6 oz (142 kg)   BP Readings from Last 3 Encounters:  04/24/16 (!) 144/92  04/03/16 (!) 138/92  03/22/16 130/82   Pulse Readings from Last 3 Encounters:  04/24/16 (!) 45  04/03/16 (!) 47  03/22/16 (!) 53    Renal function: CrCl cannot be calculated (Unknown ideal weight.).  Past Medical History:  Diagnosis Date  . Acute systolic CHF (congestive heart failure) (HCC)  02/02/2016  . Atrial fibrillation with RVR (HCC) 01/29/2016  . Bilateral leg edema 01/29/2016  . Community acquired pneumonia 01/29/2016  . Dyspnea   . Generalized edema   . Morbid obesity with BMI of 45.0-49.9, adult (HCC) 02/02/2016    Current Outpatient Prescriptions on File Prior to Visit  Medication Sig Dispense Refill  . ALPRAZolam (XANAX) 0.5 MG tablet Take 1 tablet (0.5 mg total) by mouth 3 (three) times daily as needed for anxiety. 20 tablet 0  . amiodarone (PACERONE) 200 MG tablet Take 1 tablet (200 mg total) by mouth daily. 30 tablet 6  . apixaban (ELIQUIS) 5 MG TABS tablet Take 1 tablet (5 mg total) by mouth 2 (two) times daily. 60 tablet 6  . B Complex Vitamins (B COMPLEX PO) Take 1 tablet by mouth 2 (two) times daily.    . carvedilol (COREG) 6.25 MG tablet Take 1 tablet (6.25 mg total) by mouth 2 (two) times daily. 60 tablet 11  . COENZYME Q-10 PO Take 1 capsule by mouth 4 (four) times daily.    . furosemide (LASIX) 40 MG tablet Take 1 tablet (40 mg total) by mouth daily. 30 tablet 6  . HAWTHORNE BERRY PO Take 1 capsule by mouth 2 (two) times daily.    Marland Kitchen MAGNESIUM PO Take 1,000 mg by mouth daily.    . Misc Natural Products (TURMERIC CURCUMIN) CAPS Take 2  capsules by mouth 2 (two) times daily.    Marland Kitchen NIACIN PO Take 2 tablets by mouth 2 (two) times daily.    . NON FORMULARY Take 2 capsules by mouth daily. Taurine 1,000 mg    . OVER THE COUNTER MEDICATION Beet Root: Take 1 capsule by mouth four times a day    . OVER THE COUNTER MEDICATION Ashwaganda capsules: Take 2 capsules by mouth two times a day    . OVER THE COUNTER MEDICATION Moringa capsules: Take 2 capsules by mouth two times a day    . POTASSIUM CHLORIDE PO Take 1,000 mg by mouth daily.    . sacubitril-valsartan (ENTRESTO) 97-103 MG Take 1 tablet by mouth 2 (two) times daily. 60 tablet 11   No current facility-administered medications on file prior to visit.     Allergies  Allergen Reactions  . Gluten Meal Shortness Of  Breath and Other (See Comments)    Triggered fluid around her heart a couple of years ago (NO BREAD OR PASTA)  . Wheat Bran Shortness Of Breath    Also, possible edema (around heart)  . Mold Extract [Trichophyton] Other (See Comments)    Causes congestion (sudden)  . Neosporin [Neomycin-Bacitracin Zn-Polymyx] Rash  . Other Rash    Neoprene (NO!)  . Petroleum Jelly Lip Treatment [Dimethicone] Rash  . Tape Rash     Assessment/Plan:  1. Hypertension/HF med titration: BP improved and close to goal <130/48mmHg. Pt on max dose Entresto and carvedilol (dose limited by HR). Discussed addition of spironolactone for mortality benefit in HF patients but pt is still very hesitant about prescription medications and does not wish to start any new medications at this time. Will continue current meds, checking BMET today with recent Entresto titration. F/u echo in 6 weeks to assess LVEF on optimal medication therapy.   Megan E. Supple, PharmD, CPP, BCACP [facility] [address] [place], [place] [address] Phone: [phone]; Fax: [phone] 05/08/2016 11:35 AM

## 2016-05-10 ENCOUNTER — Telehealth: Payer: Self-pay | Admitting: Cardiology

## 2016-05-10 NOTE — Telephone Encounter (Signed)
Follow Up    Per pt returning phone call from nurse. Thinks it could have been pertaining to her labs

## 2016-05-10 NOTE — Telephone Encounter (Signed)
Left message informing lab work normal.

## 2016-05-21 ENCOUNTER — Encounter: Payer: Self-pay | Admitting: Cardiology

## 2016-05-22 MED ORDER — FUROSEMIDE 40 MG PO TABS: 40.0000 mg | ORAL_TABLET | Freq: Two times a day (BID) | ORAL | 6 refills | Status: DC

## 2016-06-24 ENCOUNTER — Other Ambulatory Visit: Payer: Self-pay

## 2016-06-24 ENCOUNTER — Ambulatory Visit (HOSPITAL_COMMUNITY): Payer: BLUE CROSS/BLUE SHIELD | Attending: Cardiovascular Disease

## 2016-06-24 DIAGNOSIS — I069 Rheumatic aortic valve disease, unspecified: Secondary | ICD-10-CM | POA: Diagnosis not present

## 2016-06-24 DIAGNOSIS — I420 Dilated cardiomyopathy: Secondary | ICD-10-CM | POA: Insufficient documentation

## 2016-06-24 DIAGNOSIS — I059 Rheumatic mitral valve disease, unspecified: Secondary | ICD-10-CM | POA: Diagnosis not present

## 2016-06-24 DIAGNOSIS — I501 Left ventricular failure: Secondary | ICD-10-CM | POA: Diagnosis not present

## 2016-06-24 DIAGNOSIS — I4819 Other persistent atrial fibrillation: Secondary | ICD-10-CM

## 2016-06-24 DIAGNOSIS — I481 Persistent atrial fibrillation: Secondary | ICD-10-CM

## 2016-06-27 NOTE — Progress Notes (Addendum)
Electrophysiology Office Note   Date:  07/01/2016   ID:  Briana Kane, DOB 05-26-1950, MRN [id number]  PCP:  Dois Davenport, MD  Cardiologist:  Clifton James Primary Electrophysiologist:  Brielyn Bosak [name], MD    Chief Complaint  Patient presents with  . Follow-up    Persistent Afib     History of Present Illness: Briana Kane is a 66 y.o. female who presents today for electrophysiology evaluation.   Has a history of atrial fibrillation with rapid rates, morbid obesity, systolic heart failure. She was placed on amiodarone, and cardioverted to sinus rhythm. Since that time, ejection fraction is improved to 55-60%.   Today, denies symptoms of palpitations, chest pain, shortness of breath, orthopnea, PND, lower extremity edema, claudication, dizziness, presyncope, syncope, bleeding, or neurologic sequela. The patient is tolerating medications without difficulties and is otherwise without complaint today.    Past Medical History:  Diagnosis Date  . Acute systolic CHF (congestive heart failure) (HCC) 02/02/2016  . Atrial fibrillation with RVR (HCC) 01/29/2016  . Bilateral leg edema 01/29/2016  . Community acquired pneumonia 01/29/2016  . Dyspnea   . Generalized edema   . Morbid obesity with BMI of 45.0-49.9, adult (HCC) 02/02/2016   Past Surgical History:  Procedure Laterality Date  . CARDIOVERSION N/A 02/01/2016   Procedure: CARDIOVERSION;  Surgeon: [name], MD;  Location: [facility] ENDOSCOPY;  Service: Cardiovascular;  Laterality: N/A;  . CARDIOVERSION N/A 02/26/2016   Procedure: CARDIOVERSION;  Surgeon: Jake Bathe, MD;  Location: [facility] ENDOSCOPY;  Service: Cardiovascular;  Laterality: N/A;  . TEE WITHOUT CARDIOVERSION N/A 02/01/2016   Procedure: TRANSESOPHAGEAL ECHOCARDIOGRAM (TEE);  Surgeon: [name], MD;  Location: [facility] ENDOSCOPY;  Service: Cardiovascular;  Laterality: N/A;     Current Outpatient Prescriptions  Medication Sig Dispense Refill  . ALPRAZolam (XANAX)  0.5 MG tablet Take 1 tablet (0.5 mg total) by mouth 3 (three) times daily as needed for anxiety. 20 tablet 0  . amiodarone (PACERONE) 200 MG tablet Take 1 tablet (200 mg total) by mouth daily. 30 tablet 6  . apixaban (ELIQUIS) 5 MG TABS tablet Take 1 tablet (5 mg total) by mouth 2 (two) times daily. 60 tablet 6  . B Complex Vitamins (B COMPLEX PO) Take 1 tablet by mouth 2 (two) times daily.    . carvedilol (COREG) 6.25 MG tablet Take 1 tablet (6.25 mg total) by mouth 2 (two) times daily. 60 tablet 11  . COENZYME Q-10 PO Take 1 capsule by mouth 4 (four) times daily.    . furosemide (LASIX) 40 MG tablet Take 1 tablet (40 mg total) by mouth 2 (two) times daily. 60 tablet 6  . HAWTHORNE BERRY PO Take 1 capsule by mouth 2 (two) times daily.    Marland Kitchen MAGNESIUM PO Take 1,000 mg by mouth daily.    . Misc Natural Products (TURMERIC CURCUMIN) CAPS Take 2 capsules by mouth 2 (two) times daily.    Marland Kitchen NIACIN PO Take 2 tablets by mouth 2 (two) times daily.    . NON FORMULARY Take 2 capsules by mouth daily. Taurine 1,000 mg    . OVER THE COUNTER MEDICATION Beet Root: Take 1 capsule by mouth four times a day    . OVER THE COUNTER MEDICATION Ashwaganda capsules: Take 2 capsules by mouth two times a day    . OVER THE COUNTER MEDICATION Moringa capsules: Take 2 capsules by mouth two times a day    . POTASSIUM CHLORIDE PO Take 1,000 mg by  mouth daily.    . sacubitril-valsartan (ENTRESTO) 97-103 MG Take 1 tablet by mouth 2 (two) times daily. 60 tablet 11   No current facility-administered medications for this visit.     Allergies:   Gluten meal; Wheat bran; Mold extract [trichophyton]; Neosporin [neomycin-bacitracin zn-polymyx]; Other; Petroleum jelly lip treatment [dimethicone]; and Tape   Social History:  The patient  reports that she quit smoking about 25 years ago. She has never used smokeless tobacco. She reports that she does not drink alcohol or use drugs.   Family History:  The patient's family history  includes Hypertension in her mother.    ROS:  Please see the history of present illness.   Otherwise, review of systems is positive for none.   All other systems are reviewed and negative.     PHYSICAL EXAM: VS:  BP 130/88   Pulse (!) 58   Ht  (1.702 m)   Wt 290 lb (131.5 kg)   BMI 45.42 kg/m  , BMI Body mass index is 45.42 kg/m. GEN: Well nourished, well developed, in no acute distress  HEENT: normal  Neck: no JVD, carotid bruits, or masses Cardiac: RRR; no murmurs, rubs, or gallops,no edema  Respiratory:  clear to auscultation bilaterally, normal work of breathing GI: soft, nontender, nondistended, + BS MS: no deformity or atrophy  Skin: warm and dry Neuro:  Strength and sensation are intact Psych: euthymic mood, full affect  EKG:  EKG is ordered today. Personal review of the ekg ordered  shows sinus rhythm, rate 58, PRWP, LAFB   Recent Labs: 01/29/2016: ALT 27; B Natriuretic Peptide 513.8; TSH 2.363 02/19/2016: Hemoglobin 15.5; Platelets 245 05/08/2016: BUN 19; Creatinine, Ser 0.96; Potassium 4.9; Sodium 141    Lipid Panel     Component Value Date/Time   CHOL 107 01/30/2016 0229   TRIG 61 01/30/2016 0229   HDL 41 01/30/2016 0229   CHOLHDL 2.6 01/30/2016 0229   VLDL 12 01/30/2016 0229   LDLCALC 54 01/30/2016 0229     Wt Readings from Last 3 Encounters:  07/01/16 290 lb (131.5 kg)  05/08/16 282 lb 12 oz (128.3 kg)  05/08/16 282 lb 12 oz (128.3 kg)      Other studies Reviewed: Additional studies/ records that were reviewed today include: TTE 01/31/16  Review of the above records today demonstrates:  - Left ventricle: The cavity size was normal. Wall thickness was   normal. Systolic function was severely reduced. The estimated   ejection fraction was in the range of 25% to 30%. Diffuse   hypokinesis. - Ventricular septum: The contour showed diastolic flattening. - Mitral valve: There was moderate regurgitation directed   posteriorly. - Left atrium: The  atrium was moderately dilated. - Right ventricle: The cavity size was mildly dilated. Wall   thickness was normal. Systolic function was severely reduced. - Right atrium: The atrium was moderately dilated. - Tricuspid valve: There was moderate regurgitation.  TTE 06/24/16 - Left ventricle: The cavity size was normal. Systolic function was   normal. The estimated ejection fraction was in the range of 55%   to 60%. Wall motion was normal; there were no regional wall   motion abnormalities. Doppler parameters are consistent with both   elevated ventricular end-diastolic filling pressure and elevated   left atrial filling pressure. - Aortic valve: Small gradient across aortic valve Leaflets not   well visualized but does not appear to be significant stenosis on   BSA images. Valve area (VTI): 1.28 cm^2. Valve  area (Vmax): 1.37   cm^2. Valve area (Vmean): 1.28 cm^2. - Mitral valve: Valve area by pressure half-time: 1.71 cm^2. Valve   area by continuity equation (using LVOT flow): 1.41 cm^2. - Left atrium: The atrium was mildly dilated. - Atrial septum: No defect or patent foramen ovale was identified.  ASSESSMENT AND PLAN:  1.  Atrial fibrillation with RVR: Currently on amiodarone and Eliquis. She is in sinus rhythm today. He her ejection fraction has improved to normal. I discussed with her options of alternative medications, as amiodarone does have long-term side effects. She Brytnee Bechler likely her current prescription until she runs out of this bottle. We'll switch her to Multaq.  This patients CHA2DS2-VASc Score and unadjusted Ischemic Stroke Rate (% per year) is equal to 2.2 % stroke rate/year from a score of 2  Above score calculated as 1 point each if present [CHF, HTN, DM, Vascular=MI/PAD/Aortic Plaque, Age if 65-74, or Female] Above score calculated as 2 points each if present [Age > 75, or Stroke/TIA/TE]   2. Systolic heart failure: On Entresto and carvedilol. Ejection fraction is  improved to 55-60% in sinus rhythm. Continue current management.  3. Mitral regurgitation: Improved since ejection fraction has improved. No changes at this time.  4. Obesity: Weight loss and exercise encouraged.  Current medicines are reviewed at length with the patient today.   The patient does not have concerns regarding her medicines.  The following changes were made today:  none  Labs/ tests ordered today include:  Orders Placed This Encounter  Procedures  . EKG 12-Lead     Disposition:   FU with Quinntin Malter 6 months  Signed, Jeslin Bazinet [name], MD  07/01/2016 11:51 AM     [facility] [address] [place] [address] [phone] (office) [phone] (fax)

## 2016-07-01 ENCOUNTER — Encounter: Payer: Self-pay | Admitting: Cardiology

## 2016-07-01 ENCOUNTER — Ambulatory Visit (INDEPENDENT_AMBULATORY_CARE_PROVIDER_SITE_OTHER): Payer: BLUE CROSS/BLUE SHIELD | Admitting: Cardiology

## 2016-07-01 VITALS — BP 130/88 | HR 58 | Ht 67.0 in | Wt 290.0 lb

## 2016-07-01 DIAGNOSIS — I481 Persistent atrial fibrillation: Secondary | ICD-10-CM | POA: Diagnosis not present

## 2016-07-01 DIAGNOSIS — I4819 Other persistent atrial fibrillation: Secondary | ICD-10-CM

## 2016-07-01 DIAGNOSIS — I428 Other cardiomyopathies: Secondary | ICD-10-CM

## 2016-07-01 NOTE — Patient Instructions (Signed)
Medication Instructions:    Your physician recommends that you continue on your current medications as directed. Please refer to the Current Medication list given to you today.  - If you need a refill on your cardiac medications before your next appointment, please call your pharmacy.   Labwork:  None ordered  Testing/Procedures:  None ordered  Follow-Up:  Your physician wants you to follow-up in: 6 months with Dr. Elberta Fortis.  You will receive a reminder letter in the mail two months in advance. If you don't receive a letter, please call our office to schedule the follow-up appointment.  Thank you for choosing [facility]!!   Dory Horn, RN [phone]  Any Other Special Instructions Will Be Listed Below (If Applicable).  In 3 months when, you have completed your Amiodarone supply, call the office and the plan will be to switch you to Multaq.

## 2016-07-30 ENCOUNTER — Telehealth: Payer: Self-pay | Admitting: Cardiology

## 2016-07-30 MED ORDER — CARVEDILOL 6.25 MG PO TABS: 6.2500 mg | ORAL_TABLET | Freq: Two times a day (BID) | ORAL | 3 refills | Status: DC

## 2016-07-30 NOTE — Telephone Encounter (Signed)
Pt's medication was sent to pt's pharmacy as requested. Confirmation received.  °

## 2016-07-30 NOTE — Telephone Encounter (Signed)
New message      *STAT* If patient is at the pharmacy, call can be transferred to refill team.   1. Which medications need to be refilled? (please list name of each medication and dose if known)   carvedilol (COREG) 6.25 MG tablet Take 1 tablet (6.25 mg total) by mouth 2 (two) times daily.    2. Which pharmacy/location (including street and city if local pharmacy) is medication to be sent to? [facility]   3. Do they need a 30 day or 90 day supply?  90 days , its cheaper for insurance

## 2016-08-07 ENCOUNTER — Other Ambulatory Visit: Payer: Self-pay | Admitting: Cardiology

## 2016-08-07 MED ORDER — AMIODARONE HCL 200 MG PO TABS
200.0000 mg | ORAL_TABLET | Freq: Every day | ORAL | 3 refills | Status: DC
Start: 2016-08-07 — End: 2017-10-01

## 2016-09-27 ENCOUNTER — Other Ambulatory Visit: Payer: Self-pay | Admitting: Cardiology

## 2016-12-21 ENCOUNTER — Other Ambulatory Visit: Payer: Self-pay | Admitting: Cardiology

## 2016-12-23 NOTE — Telephone Encounter (Signed)
Eliquis 5mg  refill received; pt is 66 yrs old, wt-131.5kg, Crea-0.96 on 05/08/16, last seen by [facility] on 07/01/16; will send in refill request to requested Pharmacy.

## 2017-02-05 ENCOUNTER — Ambulatory Visit (INDEPENDENT_AMBULATORY_CARE_PROVIDER_SITE_OTHER): Payer: PPO | Admitting: Cardiology

## 2017-02-05 ENCOUNTER — Encounter: Payer: Self-pay | Admitting: Cardiology

## 2017-02-05 VITALS — BP 134/90 | HR 63 | Ht 67.0 in | Wt 293.0 lb

## 2017-02-05 DIAGNOSIS — I428 Other cardiomyopathies: Secondary | ICD-10-CM | POA: Diagnosis not present

## 2017-02-05 DIAGNOSIS — I481 Persistent atrial fibrillation: Secondary | ICD-10-CM

## 2017-02-05 DIAGNOSIS — I4819 Other persistent atrial fibrillation: Secondary | ICD-10-CM

## 2017-02-05 MED ORDER — DRONEDARONE HCL 400 MG PO TABS: 400.0000 mg | ORAL_TABLET | Freq: Two times a day (BID) | ORAL | 6 refills | Status: DC

## 2017-02-05 NOTE — Progress Notes (Signed)
Electrophysiology Office Note   Date:  02/05/2017   ID:  Briana Kane, DOB 01-07-1951, MRN [id number]  PCP:  Dois Davenport, MD  Cardiologist:  Clifton James Primary Electrophysiologist:  Will [name], MD    Chief Complaint  Patient presents with  . Follow-up    Persistent Afib     History of Present Illness: Briana Kane is a 67 y.o. female who presents today for electrophysiology evaluation.   Has a history of atrial fibrillation with rapid rates, morbid obesity, systolic heart failure. She was placed on amiodarone, and cardioverted to sinus rhythm. Since that time, ejection fraction is improved to 55-60%.  Today, denies symptoms of palpitations, chest pain, shortness of breath, orthopnea, PND, lower extremity edema, claudication, dizziness, presyncope, syncope, bleeding, or neurologic sequela. The patient is tolerating medications without difficulties.  Feeling well without complaint.  She has noted no further episodes of atrial fibrillation.   Past Medical History:  Diagnosis Date  . Acute systolic CHF (congestive heart failure) (HCC) 02/02/2016  . Atrial fibrillation with RVR (HCC) 01/29/2016  . Bilateral leg edema 01/29/2016  . Community acquired pneumonia 01/29/2016  . Dyspnea   . Generalized edema   . Morbid obesity with BMI of 45.0-49.9, adult (HCC) 02/02/2016   Past Surgical History:  Procedure Laterality Date  . CARDIOVERSION N/A 02/01/2016   Procedure: CARDIOVERSION;  Surgeon: [name], MD;  Location: [facility] ENDOSCOPY;  Service: Cardiovascular;  Laterality: N/A;  . CARDIOVERSION N/A 02/26/2016   Procedure: CARDIOVERSION;  Surgeon: Jake Bathe, MD;  Location: [facility] ENDOSCOPY;  Service: Cardiovascular;  Laterality: N/A;  . TEE WITHOUT CARDIOVERSION N/A 02/01/2016   Procedure: TRANSESOPHAGEAL ECHOCARDIOGRAM (TEE);  Surgeon: [name], MD;  Location: [facility] ENDOSCOPY;  Service: Cardiovascular;  Laterality: N/A;     Current Outpatient Medications    Medication Sig Dispense Refill  . ALPRAZolam (XANAX) 0.5 MG tablet Take 1 tablet (0.5 mg total) by mouth 3 (three) times daily as needed for anxiety. 20 tablet 0  . amiodarone (PACERONE) 200 MG tablet Take 1 tablet (200 mg total) by mouth daily. 90 tablet 3  . B Complex Vitamins (B COMPLEX PO) Take 1 tablet by mouth 2 (two) times daily.    . carvedilol (COREG) 6.25 MG tablet Take 1 tablet (6.25 mg total) by mouth 2 (two) times daily. 180 tablet 3  . COENZYME Q-10 PO Take 1 capsule by mouth 4 (four) times daily.    Marland Kitchen ELIQUIS 5 MG TABS tablet TAKE 1 TABLET (5 MG TOTAL) BY MOUTH 2 (TWO) TIMES DAILY. 60 tablet 6  . furosemide (LASIX) 40 MG tablet Take 1 tablet (40 mg total) by mouth 2 (two) times daily. 180 tablet 2  . HAWTHORNE BERRY PO Take 1 capsule by mouth 2 (two) times daily.    Marland Kitchen MAGNESIUM PO Take 1,000 mg by mouth daily.    . Misc Natural Products (TURMERIC CURCUMIN) CAPS Take 2 capsules by mouth 2 (two) times daily.    Marland Kitchen NIACIN PO Take 2 tablets by mouth 2 (two) times daily.    . NON FORMULARY Take 2 capsules by mouth daily. Taurine 1,000 mg    . OVER THE COUNTER MEDICATION Beet Root: Take 1 capsule by mouth four times a day    . OVER THE COUNTER MEDICATION Ashwaganda capsules: Take 2 capsules by mouth two times a day    . OVER THE COUNTER MEDICATION Moringa capsules: Take 2 capsules by mouth two times a day    .  POTASSIUM CHLORIDE PO Take 1,000 mg by mouth daily.    . sacubitril-valsartan (ENTRESTO) 97-103 MG Take 1 tablet by mouth 2 (two) times daily. 60 tablet 11   No current facility-administered medications for this visit.     Allergies:   Gluten meal; Wheat bran; Mold extract [trichophyton]; Neosporin [neomycin-bacitracin zn-polymyx]; Other; Petroleum jelly lip treatment [dimethicone]; and Tape   Social History:  The patient  reports that she quit smoking about 26 years ago. she has never used smokeless tobacco. She reports that she does not drink alcohol or use drugs.   Family  History:  The patient's family history includes Hypertension in her mother.   ROS:  Please see the history of present illness.   Otherwise, review of systems is positive for none.   All other systems are reviewed and negative.   PHYSICAL EXAM: VS:  BP 134/90   Pulse 63   Ht 5\' 7"  (1.702 m)   Wt 293 lb (132.9 kg)   BMI 45.89 kg/m  , BMI Body mass index is 45.89 kg/m. GEN: Well nourished, well developed, in no acute distress  HEENT: normal  Neck: no JVD, carotid bruits, or masses Cardiac: RRR; no murmurs, rubs, or gallops,no edema  Respiratory:  clear to auscultation bilaterally, normal work of breathing GI: soft, nontender, nondistended, + BS MS: no deformity or atrophy  Skin: warm and dry Neuro:  Strength and sensation are intact Psych: euthymic mood, full affect  EKG:  EKG is ordered today. Personal review of the ekg ordered shows sinus rhythm, incomplete right bundle branch block, poor R wave progression  Recent Labs: 02/19/2016: Hemoglobin 15.5; Platelets 245 05/08/2016: BUN 19; Creatinine, Ser 0.96; Potassium 4.9; Sodium 141    Lipid Panel     Component Value Date/Time   CHOL 107 01/30/2016 0229   TRIG 61 01/30/2016 0229   HDL 41 01/30/2016 0229   CHOLHDL 2.6 01/30/2016 0229   VLDL 12 01/30/2016 0229   LDLCALC 54 01/30/2016 0229     Wt Readings from Last 3 Encounters:  02/05/17 293 lb (132.9 kg)  07/01/16 290 lb (131.5 kg)  05/08/16 282 lb 12 oz (128.3 kg)      Other studies Reviewed: Additional studies/ records that were reviewed today include: TTE 01/31/16  Review of the above records today demonstrates:  - Left ventricle: The cavity size was normal. Wall thickness was   normal. Systolic function was severely reduced. The estimated   ejection fraction was in the range of 25% to 30%. Diffuse   hypokinesis. - Ventricular septum: The contour showed diastolic flattening. - Mitral valve: There was moderate regurgitation directed   posteriorly. - Left atrium:  The atrium was moderately dilated. - Right ventricle: The cavity size was mildly dilated. Wall   thickness was normal. Systolic function was severely reduced. - Right atrium: The atrium was moderately dilated. - Tricuspid valve: There was moderate regurgitation.  TTE 06/24/16 - Left ventricle: The cavity size was normal. Systolic function was   normal. The estimated ejection fraction was in the range of 55%   to 60%. Wall motion was normal; there were no regional wall   motion abnormalities. Doppler parameters are consistent with both   elevated ventricular end-diastolic filling pressure and elevated   left atrial filling pressure. - Aortic valve: Small gradient across aortic valve Leaflets not   well visualized but does not appear to be significant stenosis on   BSA images. Valve area (VTI): 1.28 cm^2. Valve area (Vmax): 1.37  cm^2. Valve area (Vmean): 1.28 cm^2. - Mitral valve: Valve area by pressure half-time: 1.71 cm^2. Valve   area by continuity equation (using LVOT flow): 1.41 cm^2. - Left atrium: The atrium was mildly dilated. - Atrial septum: No defect or patent foramen ovale was identified.  ASSESSMENT AND PLAN:  1.  Atrial fibrillation with RVR: Early on amiodarone and in sinus rhythm.  Her ejection fraction has improved.  Due to side effects of amiodarone, we will switch her to [facility].  This patients CHA2DS2-VASc Score and unadjusted Ischemic Stroke Rate (% per year) is equal to 2.2 % stroke rate/year from a score of 2  Above score calculated as 1 point each if present [CHF, HTN, DM, Vascular=MI/PAD/Aortic Plaque, Age if 65-74, or Female] Above score calculated as 2 points each if present [Age > 75, or Stroke/TIA/TE]  2. Systolic heart failure: Currently on Entresto and carvedilol.  Ejection fraction has improved to normal.  No changes.  3. Mitral regurgitation: Improved with improvement in ejection fraction.  No changes.  4. Obesity: Weight loss and exercise  encouraged.  Current medicines are reviewed at length with the patient today.   The patient does not have concerns regarding her medicines.  The following changes were made today: Amiodarone, start Multitak  Labs/ tests ordered today include:  Orders Placed This Encounter  Procedures  . EKG 12-Lead     Disposition:   FU with Will Camnitz 6 months  Signed, Will [name], MD  02/05/2017 11:20 AM     [facility] [address] [place] [address] [phone] (office) [phone] (fax)

## 2017-02-05 NOTE — Patient Instructions (Addendum)
Medication Instructions:  Your physician has recommended you make the following change in your medication:  1. STOP Amiodarone 2. START Multaq 400 mg twice daily  * If you need a refill on your cardiac medications before your next appointment, please call your pharmacy. *  Labwork: None ordered   Testing/Procedures: None ordered  Follow-Up: Your physician wants you to follow-up in: 6 months with Dr. Elberta Fortis. You will receive a reminder letter in the mail two months in advance. If you don't receive a letter, please call our office to schedule the follow-up appointment.  Thank you for choosing [facility]!!   Dory Horn, RN [phone]  Any Other Special Instructions Will Be Listed Below (If Applicable). Dronedarone tablets What is this medicine? DRONEDARONE (droe NE da rone) is an antiarrhythmic drug. It helps make your heart beat regularly. This medicine may be used for other purposes; ask your health care provider or pharmacist if you have questions. COMMON BRAND NAME(S): Multaq What should I tell my health care provider before I take this medicine? They need to know if you have any of these conditions: -heart failure -history of irregular heartbeat -liver disease -liver or lung problems with the past use of amiodarone -low levels of magnesium in the blood -low levels of potassium in the blood -other heart disease -an unusual or allergic reaction to dronedarone, other medicines, foods, dyes, or preservatives -pregnant or trying to get pregnant -breast-feeding How should I use this medicine? Take this medicine by mouth with a glass of water. Follow the directions on the prescription label. Take one tablet with the morning meal and one tablet with the evening meal. Do not take your medicine more often than directed. Do not stop taking except on the advice of your doctor or health care professional. A special MedGuide will be given to you by the pharmacist with each  prescription and refill. Be sure to read this information carefully each time. Talk to your pediatrician regarding the use of this medicine in children. Special care may be needed. Overdosage: If you think you have taken too much of this medicine contact a poison control center or emergency room at once. NOTE: This medicine is only for you. Do not share this medicine with others. What if I miss a dose? If you miss a dose, take it as soon as you can. If it is almost time for your next dose, take only that dose. Do not take double or extra doses. What may interact with this medicine? Do not take this medicine with any of the following medications: -arsenic trioxide -certain antibiotics like clarithromycin, erythromycin, pentamidine, telithromycin, troleandomycin -certain medicines for depression like tricyclic antidepressants -certain medicines for fungal infections like fluconazole, itraconazole, ketoconazole, posaconazole, voriconazole -certain medicines for irregular heart beat like amiodarone, disopyramide, dofetilide, flecainide, ibutilide, quinidine, propafenone, sotalol -certain medicines for malaria like chloroquine, halofantrine -cisapride -cyclosporine -droperidol -haloperidol -methadone -other medicines that prolong the QT interval (cause an abnormal heart rhythm) -pimozide -nefazodone -phenothiazines like chlorpromazine, mesoridazine, prochlorperazine, thioridazine -ritonavir -ziprasidone This medicine may also interact with the following medications: -certain medicines for blood pressure, heart disease, or irregular heart beat like diltiazem, metoprolol, propranolol, verapamil -certain medicines for cholesterol like atorvastatin, lovastatin, simvastatin -certain medicines for seizures like carbamazepine, phenobarbital, phenytoin -digoxin -grapefruit juice -rifampin -sirolimus -St. John's Wort -tacrolimus This list may not describe all possible interactions. Give your  health care provider a list of all the medicines, herbs, non-prescription drugs, or dietary supplements you use. Also tell them if  you smoke, drink alcohol, or use illegal drugs. Some items may interact with your medicine. What should I watch for while using this medicine? Your condition will be monitored closely when you first begin therapy. Often, this drug is first started in a hospital or other monitored health care setting. Once you are on maintenance therapy, visit your doctor or health care professional for regular checks on your progress. Because your condition and use of this medicine carry some risk, it is a good idea to carry an identification card, necklace or bracelet with details of your condition, medications, and doctor or health care professional. Bonita Quin may get drowsy or dizzy. Do not drive, use machinery, or do anything that needs mental alertness until you know how this medicine affects you. Do not stand or sit up quickly, especially if you are an older patient. This reduces the risk of dizzy or fainting spells. What side effects may I notice from receiving this medicine? Side effects that you should report to your doctor or health care professional as soon as possible: -allergic reactions like skin rash, itching or hives, swelling of the face, lips, or tongue -breathing problems -cough -dark urine -fast, irregular heartbeat -general ill feeling or flu-like symptoms -light-colored stools -loss of appetite, nausea -right upper belly pain -slow heartbeat -stomach pain -swelling of the legs or ankles -unusually weak or tired -weight gain -yellowing of the eyes or skin Side effects that usually do not require medical attention (report to your doctor or health care professional if they continue or are bothersome): -nausea -vomiting -stomach pain This list may not describe all possible side effects. Call your doctor for medical advice about side effects. You may report side  effects to FDA at 1-800-FDA-1088. Where should I keep my medicine? Keep out of the reach of children. Store at room temperature between 15 and 30 degrees C (59 and 86 degrees F). Throw away any unused medicine after the expiration date. NOTE: This sheet is a summary. It may not cover all possible information. If you have questions about this medicine, talk to your doctor, pharmacist, or health care provider.  2018 Elsevier/Gold Standard (2015-02-09 12:43:06)

## 2017-02-20 ENCOUNTER — Encounter: Payer: Self-pay | Admitting: Cardiology

## 2017-02-20 MED ORDER — APIXABAN 5 MG PO TABS: 5.0000 mg | ORAL_TABLET | Freq: Two times a day (BID) | ORAL | 6 refills | Status: DC

## 2017-02-20 MED ORDER — SACUBITRIL-VALSARTAN 97-103 MG PO TABS: 1.0000 | ORAL_TABLET | Freq: Two times a day (BID) | ORAL | 11 refills | Status: DC

## 2017-02-20 MED ORDER — DRONEDARONE HCL 400 MG PO TABS: 400.0000 mg | ORAL_TABLET | Freq: Two times a day (BID) | ORAL | 6 refills | Status: DC

## 2017-02-20 NOTE — Telephone Encounter (Signed)
Informed patient that our office would address the PA need for Multaq.  She understands we will call her by tomorrow/next week with information about this process. Explained that other Rx (Eliquis) was [facility] to [facility] and pt was needing it to [address] location.  Pt understands I will send all 3 Rx to that pharmacy.  Also explained that I would ask them to not fill the Multaq and explain it is for a quote only. Pt thanks me and voiced understanding

## 2017-02-26 ENCOUNTER — Telehealth: Payer: Self-pay

## 2017-02-26 NOTE — Telephone Encounter (Addendum)
We received a letter via fax from [facility] stating that Multaq is a non formulary medication and that the pts plan will not cover. I called the number provided in letter [phone]) and s/w Tyree.  Per Tyree she is sending the information I provided to her concerning the pts need for Multaq to their clinical team and that if more is needed for this PA they will seed me a fax with questions.  Case # for PA: [id number]

## 2017-02-27 NOTE — Telephone Encounter (Signed)
Received fax from [facility] requesting further information/diagnosis for patients MULTAQ, completed form and faxed back to [place].

## 2017-02-28 ENCOUNTER — Encounter: Payer: Self-pay | Admitting: Cardiology

## 2017-02-28 NOTE — Telephone Encounter (Signed)
Received another form from [employer]. Form completed and placed in Dr Elberta Fortis mail bin awaiting his signature.

## 2017-03-05 ENCOUNTER — Telehealth: Payer: Self-pay | Admitting: Cardiology

## 2017-03-05 NOTE — Telephone Encounter (Signed)
New message ° ° °Patient returning call back to nurse.  °

## 2017-03-05 NOTE — Telephone Encounter (Signed)
Pt and I agreed to discuss in more detail tomorrow.

## 2017-03-06 NOTE — Telephone Encounter (Signed)
Pt does not wish to start Tikosyn at this time.  She would be willing to start Flecainide or stay on Amiodarone.  She is aware I will discuss w/ Dr. Elberta Fortis tomorrow and let her know.

## 2017-03-13 ENCOUNTER — Encounter: Payer: Self-pay | Admitting: Cardiology

## 2017-04-07 ENCOUNTER — Telehealth: Payer: Self-pay

## 2017-04-07 NOTE — Telephone Encounter (Signed)
**Note De-identified Briana Kane Obfuscation** I have done an Entresto PA through covermymeds. 

## 2017-04-08 NOTE — Telephone Encounter (Signed)
**Note De-Identified Terez Montee Obfuscation** Approval received Illana Nolting fax from [facility] on the pts [place] PA. Approval good from 04/07/17-01/20/2018  I have notified the pts pharmacy.

## 2017-08-25 ENCOUNTER — Other Ambulatory Visit: Payer: Self-pay | Admitting: *Deleted

## 2017-08-25 NOTE — Patient Outreach (Signed)
[facility]) Care Management  08/25/2017  Briana Kane August 08, 1950 [id number]   Telephone Screen  Referral Date:08/21/17 Referral Source: HTA concierge referral Referral Reason:Member would like additional resources while in coverage gap Insurance: HTA  Outreach attempt #1 x 2 initial call pt answer and disconnected  Then [facility] RN CM returned the call  No answer. [facility] RN CM left HIPAA compliant voicemail message along with CM's contact info.    Plan: [facility] RN CM sent an unsuccessful outreach letter and scheduled this patient for another call attempt within 4 business days   Kimberly L. Noelle Penner, RN, BSN, CCM [facility] Telephonic Care Management Care Coordinator Direct number [phone]  Main [facility] number [phone] Fax number [phone]

## 2017-08-26 ENCOUNTER — Other Ambulatory Visit: Payer: Self-pay | Admitting: *Deleted

## 2017-08-26 NOTE — Patient Outreach (Signed)
[facility]) Care Management  08/26/2017  Briana Kane [birth year] [id number]   Telephone Screen  Referral Date:08/21/17 Referral Source: HTA concierge referral Referral Reason:Member would like additional resources while in coverage gap Insurance: HTA  Outreach attempt #2  No answer. [facility] RN CM left HIPAA compliant voicemail message along with CM's contact info.    Plan: [facility] RN CM scheduled this patient for another call attempt within 4 business days   Mance Vallejo L. Noelle Penner, RN, BSN, CCM [facility] Telephonic Care Management Care Coordinator Direct number [phone]  Main [facility] number [phone] Fax number [phone]

## 2017-08-28 ENCOUNTER — Other Ambulatory Visit: Payer: Self-pay | Admitting: *Deleted

## 2017-08-28 NOTE — Patient Outreach (Signed)
[facility]) Care Management  08/28/2017  Briana Kane [birth year] [id number]   Telephone Screen  Referral Date:08/21/17 Referral Source:HTA concierge referral Referral Reason:Member would like additional resources while in coverage gap Insurance:HTA  Outreach attempt #3  No answer. [facility] RN CM left HIPAA compliant voicemail message along with CM's contact info.    Plan: [facility] RN CM scheduled this patient for case closure per the [facility] call attempt workflow  Pending possible return call from patient after a letter was sent and 3 voice messages left   Jaycee Pelzer L. Noelle Penner, RN, BSN, CCM [facility] Telephonic Care Management Care Coordinator Office number [phone] Mobile number [phone]  Main [facility] number [id number] Fax number [phone]

## 2017-09-09 ENCOUNTER — Other Ambulatory Visit: Payer: Self-pay | Admitting: *Deleted

## 2017-09-09 NOTE — Patient Outreach (Signed)
[facility]) Care Management  09/09/2017  Briana Kane 1950-11-29 [id number]   Case closure   Call attempts made on 08/25/17, 08/26/17 and 08/28/17 Unsuccessful outreach letter sent on 08/25/17 without a response   Plan [facility] RN CM will close case after no response from patient within 10 business days. Unable to reach

## 2017-09-10 ENCOUNTER — Encounter: Payer: Self-pay | Admitting: Cardiology

## 2017-09-10 ENCOUNTER — Ambulatory Visit: Payer: PPO | Admitting: Cardiology

## 2017-09-10 VITALS — BP 126/80 | HR 71 | Ht 67.0 in | Wt 292.6 lb

## 2017-09-10 DIAGNOSIS — I481 Persistent atrial fibrillation: Secondary | ICD-10-CM | POA: Diagnosis not present

## 2017-09-10 DIAGNOSIS — Z79899 Other long term (current) drug therapy: Secondary | ICD-10-CM | POA: Diagnosis not present

## 2017-09-10 DIAGNOSIS — I428 Other cardiomyopathies: Secondary | ICD-10-CM

## 2017-09-10 DIAGNOSIS — I4819 Other persistent atrial fibrillation: Secondary | ICD-10-CM

## 2017-09-10 NOTE — Patient Instructions (Addendum)
Medication Instructions:  Your physician recommends that you continue on your current medications as directed. Please refer to the Current Medication list given to you today.  * If you need a refill on your cardiac medications before your next appointment, please call your pharmacy.   Labwork: None ordered  Testing/Procedures: None ordered  Follow-Up: Your physician wants you to follow-up in: 6 months with Dr. Camnitz.  You will receive a reminder letter in the mail two months in advance. If you don't receive a letter, please call our office to schedule the follow-up appointment.  *Please note that any paperwork needing to be filled out by the provider will need to be addressed at the front desk prior to seeing the provider. Please note that any FMLA, disability or other documents regarding health condition is subject to a $25.00 charge that must be received prior to completion of paperwork in the form of a money order or check.  Thank you for choosing [facility]!!   Trystyn Sitts, RN [phone]        

## 2017-09-10 NOTE — Addendum Note (Signed)
Addended by: Baird Lyons on: 09/10/2017 11:54 AM   Modules accepted: Orders

## 2017-09-10 NOTE — Progress Notes (Signed)
Electrophysiology Office Note   Date:  09/10/2017   ID:  Briana Kane, DOB 1950/06/04, MRN [id number]  PCP:  Dois Davenport, MD  Cardiologist:  Clifton James Primary Electrophysiologist:  Jaiveon Suppes [name], MD    No chief complaint on file.    History of Present Illness: Briana Kane is a 67 y.o. female who presents today for electrophysiology evaluation.   Has a history of atrial fibrillation with rapid rates, morbid obesity, systolic heart failure. She was placed on amiodarone, and cardioverted to sinus rhythm. Since that time, ejection fraction is improved to 55-60%.  Today, denies symptoms of palpitations, chest pain, shortness of breath, orthopnea, PND, lower extremity edema, claudication, dizziness, presyncope, syncope, bleeding, or neurologic sequela. The patient is tolerating medications without difficulties.  Overall she is feeling well.  She has not had any further episodes of atrial fibrillation.  She has no chest pain or shortness of breath.  She is able to do all of her daily activities without issue.   Past Medical History:  Diagnosis Date  . Acute systolic CHF (congestive heart failure) (HCC) 02/02/2016  . Atrial fibrillation with RVR (HCC) 01/29/2016  . Bilateral leg edema 01/29/2016  . Community acquired pneumonia 01/29/2016  . Dyspnea   . Generalized edema   . Morbid obesity with BMI of 45.0-49.9, adult (HCC) 02/02/2016   Past Surgical History:  Procedure Laterality Date  . CARDIOVERSION N/A 02/01/2016   Procedure: CARDIOVERSION;  Surgeon: [name], MD;  Location: [facility] ENDOSCOPY;  Service: Cardiovascular;  Laterality: N/A;  . CARDIOVERSION N/A 02/26/2016   Procedure: CARDIOVERSION;  Surgeon: Jake Bathe, MD;  Location: [facility] ENDOSCOPY;  Service: Cardiovascular;  Laterality: N/A;  . TEE WITHOUT CARDIOVERSION N/A 02/01/2016   Procedure: TRANSESOPHAGEAL ECHOCARDIOGRAM (TEE);  Surgeon: [name], MD;  Location: [facility] ENDOSCOPY;  Service: Cardiovascular;   Laterality: N/A;     Current Outpatient Medications  Medication Sig Dispense Refill  . ALPRAZolam (XANAX) 0.5 MG tablet Take 1 tablet (0.5 mg total) by mouth 3 (three) times daily as needed for anxiety. 20 tablet 0  . amiodarone (PACERONE) 200 MG tablet Take 1 tablet (200 mg total) by mouth daily. 90 tablet 3  . apixaban (ELIQUIS) 5 MG TABS tablet Take 1 tablet (5 mg total) by mouth 2 (two) times daily. 60 tablet 6  . B Complex Vitamins (B COMPLEX PO) Take 1 tablet by mouth 2 (two) times daily.    . carvedilol (COREG) 6.25 MG tablet Take 1 tablet (6.25 mg total) by mouth 2 (two) times daily. 180 tablet 3  . COENZYME Q-10 PO Take 1 capsule by mouth 4 (four) times daily.    . furosemide (LASIX) 40 MG tablet Take 1 tablet (40 mg total) by mouth 2 (two) times daily. 180 tablet 2  . HAWTHORNE BERRY PO Take 1 capsule by mouth 2 (two) times daily.    Marland Kitchen MAGNESIUM PO Take 1,000 mg by mouth daily.    . Misc Natural Products (TURMERIC CURCUMIN) CAPS Take 2 capsules by mouth 2 (two) times daily.    Marland Kitchen NIACIN PO Take 2 tablets by mouth 2 (two) times daily.    . NON FORMULARY Take 2 capsules by mouth daily. Taurine 1,000 mg    . OVER THE COUNTER MEDICATION Beet Root: Take 1 capsule by mouth four times a day    . OVER THE COUNTER MEDICATION Ashwaganda capsules: Take 2 capsules by mouth two times a day    . OVER THE COUNTER MEDICATION  Moringa capsules: Take 2 capsules by mouth two times a day    . POTASSIUM CHLORIDE PO Take 1,000 mg by mouth daily.    . sacubitril-valsartan (ENTRESTO) 97-103 MG Take 1 tablet by mouth 2 (two) times daily. 60 tablet 11   No current facility-administered medications for this visit.     Allergies:   Gluten meal; Wheat bran; Mold extract [trichophyton]; Neosporin [neomycin-bacitracin zn-polymyx]; Other; Petroleum jelly lip treatment [dimethicone]; and Tape   Social History:  The patient  reports that she quit smoking about 26 years ago. She has never used smokeless tobacco.  She reports that she does not drink alcohol or use drugs.   Family History:  The patient's family history includes Hypertension in her mother.   ROS:  Please see the history of present illness.   Otherwise, review of systems is positive for none.   All other systems are reviewed and negative.   PHYSICAL EXAM: VS:  BP 126/80   Pulse 71   Ht 5\' 7"  (1.702 m)   Wt 292 lb 9.6 oz (132.7 kg)   SpO2 97%   BMI 45.83 kg/m  , BMI Body mass index is 45.83 kg/m. GEN: Well nourished, well developed, in no acute distress  HEENT: normal  Neck: no JVD, carotid bruits, or masses Cardiac: RRR; no murmurs, rubs, or gallops,no edema  Respiratory:  clear to auscultation bilaterally, normal work of breathing GI: soft, nontender, nondistended, + BS MS: no deformity or atrophy  Skin: warm and dry Neuro:  Strength and sensation are intact Psych: euthymic mood, full affect  EKG:  EKG is not ordered today. Personal review of the ekg ordered shows SR, LAFB, iRBBB, septal Q waves   Recent Labs: No results found for requested labs within last 8760 hours.    Lipid Panel     Component Value Date/Time   CHOL 107 01/30/2016 0229   TRIG 61 01/30/2016 0229   HDL 41 01/30/2016 0229   CHOLHDL 2.6 01/30/2016 0229   VLDL 12 01/30/2016 0229   LDLCALC 54 01/30/2016 0229     Wt Readings from Last 3 Encounters:  09/10/17 292 lb 9.6 oz (132.7 kg)  02/05/17 293 lb (132.9 kg)  07/01/16 290 lb (131.5 kg)      Other studies Reviewed: Additional studies/ records that were reviewed today include: TTE 01/31/16  Review of the above records today demonstrates:  - Left ventricle: The cavity size was normal. Wall thickness was   normal. Systolic function was severely reduced. The estimated   ejection fraction was in the range of 25% to 30%. Diffuse   hypokinesis. - Ventricular septum: The contour showed diastolic flattening. - Mitral valve: There was moderate regurgitation directed   posteriorly. - Left atrium:  The atrium was moderately dilated. - Right ventricle: The cavity size was mildly dilated. Wall   thickness was normal. Systolic function was severely reduced. - Right atrium: The atrium was moderately dilated. - Tricuspid valve: There was moderate regurgitation.  TTE 06/24/16 - Left ventricle: The cavity size was normal. Systolic function was   normal. The estimated ejection fraction was in the range of 55%   to 60%. Wall motion was normal; there were no regional wall   motion abnormalities. Doppler parameters are consistent with both   elevated ventricular end-diastolic filling pressure and elevated   left atrial filling pressure. - Aortic valve: Small gradient across aortic valve Leaflets not   well visualized but does not appear to be significant stenosis on  BSA images. Valve area (VTI): 1.28 cm^2. Valve area (Vmax): 1.37   cm^2. Valve area (Vmean): 1.28 cm^2. - Mitral valve: Valve area by pressure half-time: 1.71 cm^2. Valve   area by continuity equation (using LVOT flow): 1.41 cm^2. - Left atrium: The atrium was mildly dilated. - Atrial septum: No defect or patent foramen ovale was identified.  ASSESSMENT AND PLAN:  1.  Atrial fibrillation with RVR: Currently on Eliquis and amiodarone.  She Mackena Plummer take medications that are higher in cost.  She remains in sinus rhythm.  Jonessa Triplett check amiodarone labs today.  This patients CHA2DS2-VASc Score and unadjusted Ischemic Stroke Rate (% per year) is equal to 2.2 % stroke rate/year from a score of 2  Above score calculated as 1 point each if present [CHF, HTN, DM, Vascular=MI/PAD/Aortic Plaque, Age if 65-74, or Female] Above score calculated as 2 points each if present [Age > 75, or Stroke/TIA/TE]   2. Systolic heart failure: Currently on carvedilol and Entresto.  Tolerating well.  Ejection fraction has improved.  3. Mitral regurgitation: Proved with improvement of her ejection fraction.  4. Obesity: Weight loss and exercise  encouraged  Current medicines are reviewed at length with the patient today.   The patient does not have concerns regarding her medicines.  The following changes were made today: None  Labs/ tests ordered today include:  Orders Placed This Encounter  Procedures  . Basic Metabolic Panel (BMET)  . CBC  . TSH  . Hepatic function panel  . EKG 12-Lead     Disposition:   FU with Brance Dartt 6 months  Signed, Berneice Zettlemoyer [name], MD  09/10/2017 11:47 AM     [facility] [address] [place] [address] [phone] (office) [phone] (fax)

## 2017-09-11 NOTE — Addendum Note (Signed)
Addended by: Lajoyce Corners on: 09/11/2017 10:36 AM   Modules accepted: Orders

## 2017-10-01 ENCOUNTER — Other Ambulatory Visit: Payer: Self-pay | Admitting: Cardiology

## 2017-10-02 ENCOUNTER — Other Ambulatory Visit: Payer: Self-pay | Admitting: Cardiology

## 2017-10-02 MED ORDER — CARVEDILOL 6.25 MG PO TABS: 6.2500 mg | ORAL_TABLET | Freq: Two times a day (BID) | ORAL | 3 refills | Status: DC

## 2018-04-03 ENCOUNTER — Telehealth: Payer: Self-pay | Admitting: Cardiology

## 2018-04-03 NOTE — Telephone Encounter (Signed)
New Message   Patient is requesting to switch from Dr. Elberta Fortis to Dr. Elease Hashimoto. Please advise.

## 2018-04-06 NOTE — Telephone Encounter (Signed)
?   Why am I included in this have never seen and your note indicates change from camnitz to [place] Her problem is afib and would make more sense for her to see another EP doctor Also her original consult Was done by [facility]

## 2018-04-06 NOTE — Telephone Encounter (Signed)
OK with me. Happy to see her again if needed.

## 2018-04-06 NOTE — Telephone Encounter (Signed)
One of her primary issues is atrial fib so I do not think it would be advantageous for her to switch to a non - EP doc entirely.   I have performed a cardioversion on her but otherwise have not seen her for any specific evaluation.    I would be happy to see her for her non-EP issues ( although these seem to be very well controlled at this point ) but she needs to continue to be seen by an EP doctor also

## 2018-04-07 ENCOUNTER — Other Ambulatory Visit: Payer: Self-pay | Admitting: Cardiology

## 2018-04-09 IMAGING — CR DG CHEST 1V PORT
1 series · 1 of 1 positions shown · non-contrast
Comparison: None.

CLINICAL DATA: 65-year-old female with shortness of breath and
cough for 2 weeks. Former smoker. Initial encounter.

EXAM:
PORTABLE CHEST 1 VIEW

[AP]
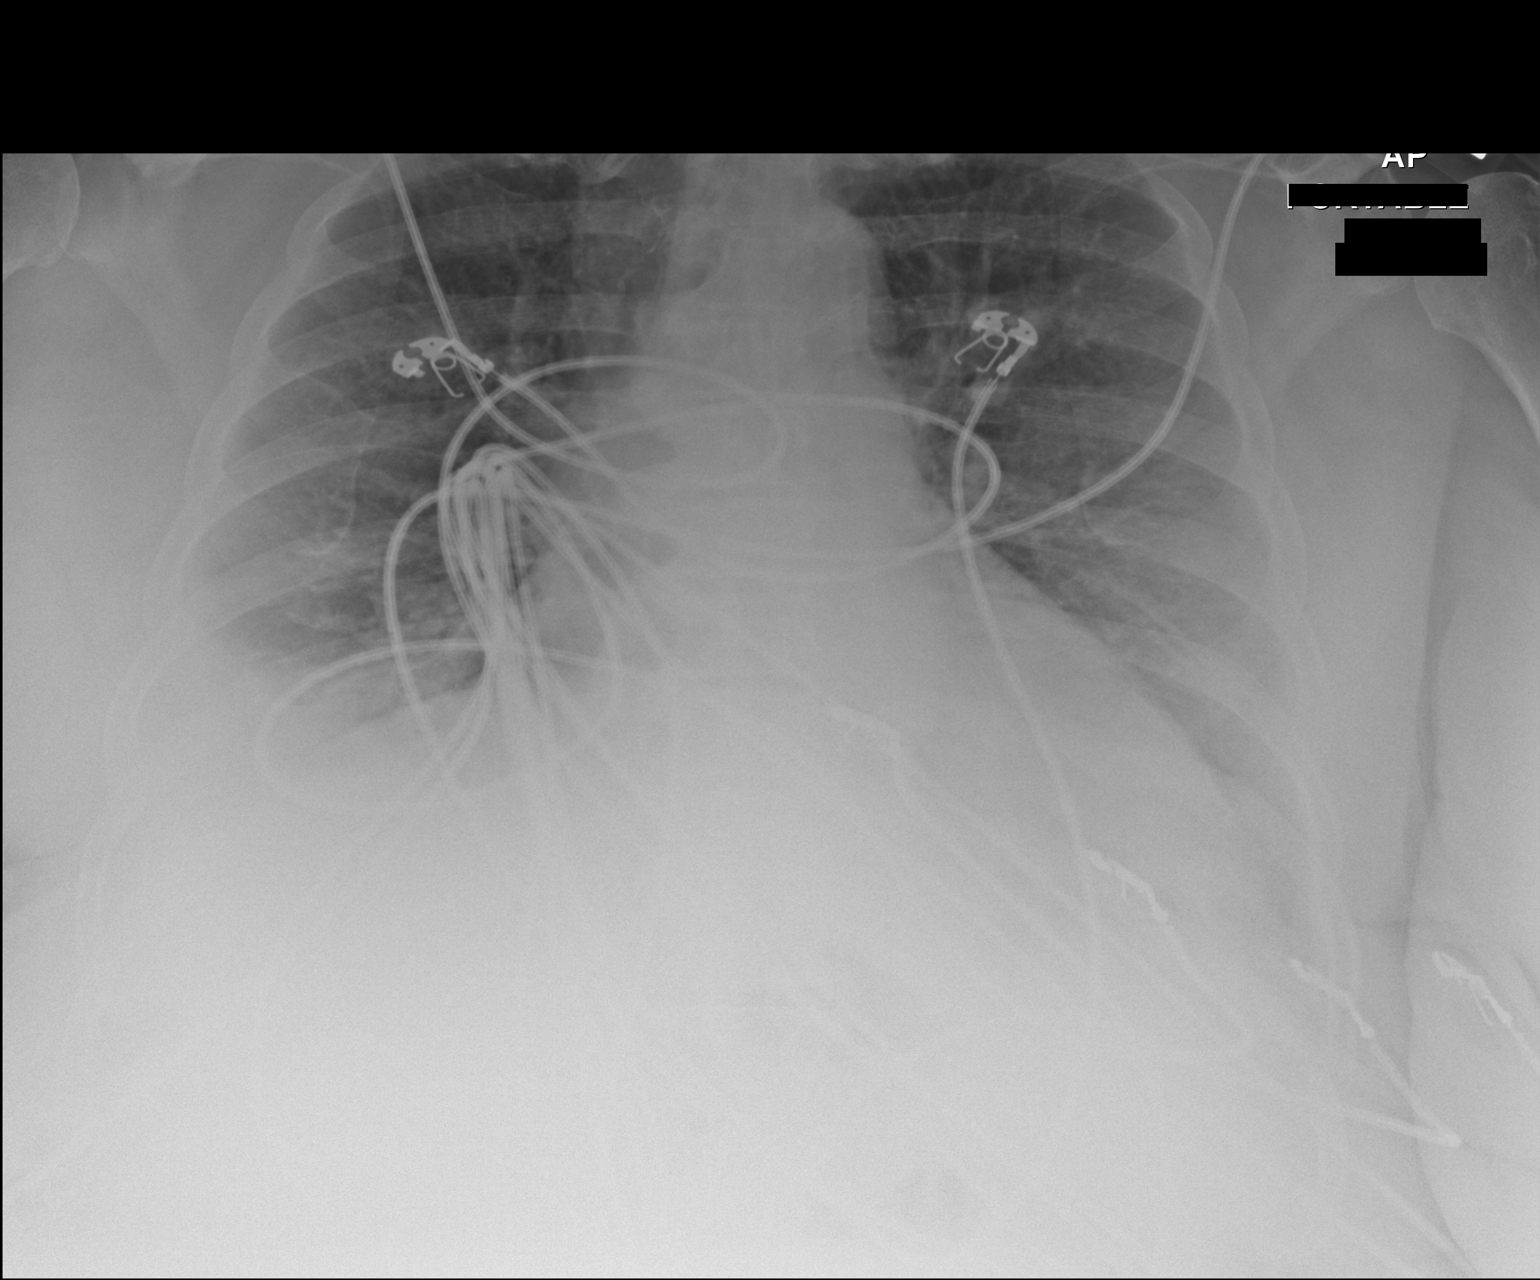

[1 of 1 positions shown; findings below may reference images not displayed]

FINDINGS: Portable AP upright view at 2190 hours. Suggestion of cardiomegaly
but other mediastinal contours appear normal. Veiling opacity at
both lung bases. No superimposed pneumothorax or definite pulmonary
edema. No other confluent pulmonary opacity. Negative visible bowel
gas pattern in the upper abdomen.
IMPRESSION: 1. Veiling bibasilar pulmonary opacity is nonspecific but might
relate to bilateral pleural effusions. PA and lateral views of the
chest recommended when possible.
2. Suggestion of cardiomegaly.  No acute pulmonary edema.

## 2018-04-14 IMAGING — CR DG CHEST 1V PORT
1 series · 1 of 1 positions shown · non-contrast
Comparison: 01/29/2016

CLINICAL DATA: Pt here with suspected CAP and afib. Pt experiencing
dyspnea.

EXAM:
PORTABLE CHEST 1 VIEW

[AP]
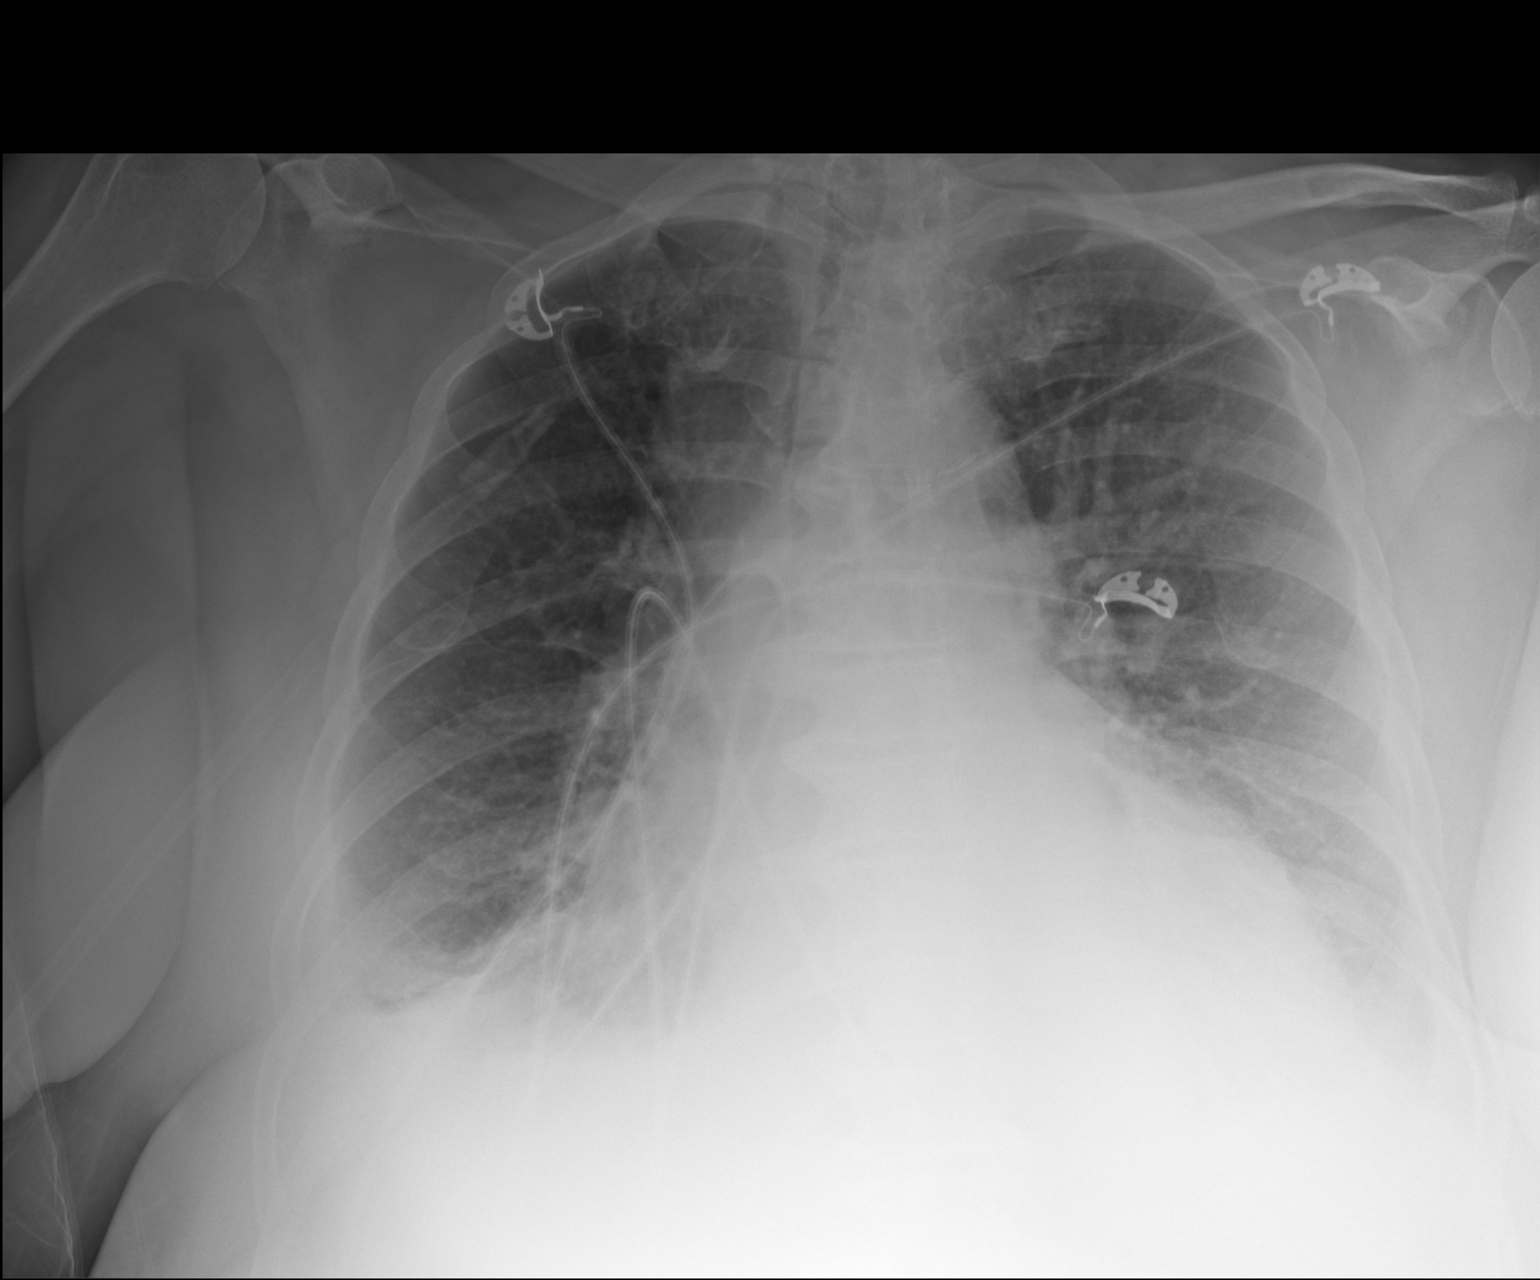

[1 of 1 positions shown; findings below may reference images not displayed]

FINDINGS: Bibasilar lung opacity obscures hemidiaphragms, similar to the prior
exam. Remainder of the lungs is clear. No pneumothorax.

Cardiac silhouette is mildly enlarged. No mediastinal or hilar
masses.
IMPRESSION: 1. Persistent bibasilar opacity. This is likely a combination of
small effusions with either atelectasis or pneumonia. No pulmonary
edema. Mild cardiomegaly.

## 2018-10-04 ENCOUNTER — Other Ambulatory Visit: Payer: Self-pay | Admitting: Cardiology

## 2018-10-05 ENCOUNTER — Other Ambulatory Visit: Payer: Self-pay | Admitting: Cardiology

## 2018-10-05 ENCOUNTER — Telehealth: Payer: Self-pay | Admitting: *Deleted

## 2018-10-05 NOTE — Telephone Encounter (Signed)
Informed Pharmacist that we monitor this every 6 months.

## 2018-10-05 NOTE — Telephone Encounter (Signed)
Received fax notification from [facility] indicating that amiodarone can cause significant changes in thyroid function. The fax notes that no thyroid meds are on the patients profile. They are wanting to verify that the patients thyroid function is being monitored. Please advise. Thanks, MI

## 2019-04-28 ENCOUNTER — Telehealth: Payer: Self-pay | Admitting: Cardiology

## 2019-04-28 NOTE — Telephone Encounter (Signed)
Given that her primary is atrial fibrillation it probably makes more sense to see Dr. Elberta Fortis.  In addition to that, she lives in [place] my offices are in [place] and [place].

## 2019-04-28 NOTE — Telephone Encounter (Signed)
Briana Kane is requesting a provider switch from Dr. Elberta Fortis to Dr. Purvis Sheffield. Please advise.

## 2019-04-29 NOTE — Telephone Encounter (Signed)
OK with either way.

## 2019-06-18 ENCOUNTER — Encounter (HOSPITAL_COMMUNITY): Payer: Self-pay | Admitting: Emergency Medicine

## 2019-06-18 ENCOUNTER — Emergency Department (HOSPITAL_COMMUNITY)
Admission: EM | Admit: 2019-06-18 | Discharge: 2019-06-19 | Disposition: A | Discharge status: Home / Self Care | Payer: PPO | Attending: Emergency Medicine | Admitting: Emergency Medicine

## 2019-06-18 ENCOUNTER — Other Ambulatory Visit: Payer: Self-pay

## 2019-06-18 DIAGNOSIS — I4891 Unspecified atrial fibrillation: Secondary | ICD-10-CM | POA: Insufficient documentation

## 2019-06-18 DIAGNOSIS — R002 Palpitations: Secondary | ICD-10-CM | POA: Diagnosis present

## 2019-06-18 DIAGNOSIS — I5021 Acute systolic (congestive) heart failure: Secondary | ICD-10-CM | POA: Insufficient documentation

## 2019-06-18 LAB — CBC
HCT: 46.1 % — ABNORMAL HIGH (ref 36.0–46.0)
Hemoglobin: 15.2 g/dL — ABNORMAL HIGH (ref 12.0–15.0)
MCH: 29.5 pg (ref 26.0–34.0)
MCHC: 33 g/dL (ref 30.0–36.0)
MCV: 89.5 fL (ref 80.0–100.0)
Platelets: 258 10*3/uL (ref 150–400)
RBC: 5.15 MIL/uL — ABNORMAL HIGH (ref 3.87–5.11)
RDW: 13.3 % (ref 11.5–15.5)
WBC: 7.9 10*3/uL (ref 4.0–10.5)
nRBC: 0 % (ref 0.0–0.2)

## 2019-06-18 LAB — BASIC METABOLIC PANEL
Anion gap: 13 (ref 5–15)
BUN: 13 mg/dL (ref 8–23)
CO2: 23 mmol/L (ref 22–32)
Calcium: 9.5 mg/dL (ref 8.9–10.3)
Chloride: 103 mmol/L (ref 98–111)
Creatinine, Ser: 0.84 mg/dL (ref 0.44–1.00)
GFR calc Af Amer: >60 mL/min (ref >60)
GFR calc non Af Amer: >60 mL/min (ref >60)
Glucose, Bld: 154 mg/dL — ABNORMAL HIGH (ref 70–99)
Potassium: 4.7 mmol/L (ref 3.5–5.1)
Sodium: 139 mmol/L (ref 135–145)

## 2019-06-18 LAB — MAGNESIUM: Magnesium: 2 mg/dL (ref 1.7–2.4)

## 2019-06-18 MED ORDER — METOPROLOL SUCCINATE ER 25 MG PO TB24
12.5000 mg | ORAL_TABLET | Freq: Every day | ORAL | 0 refills | Status: DC
Start: 2019-06-18 — End: 2019-09-23

## 2019-06-18 MED ORDER — METOPROLOL TARTRATE 5 MG/5ML IV SOLN
5.0000 mg | Freq: Once | INTRAVENOUS | Status: AC
  Administered 2019-06-18: 5 mg via INTRAVENOUS
  Filled 2019-06-18: qty 5

## 2019-06-18 MED ORDER — RIVAROXABAN 20 MG PO TABS
20.0000 mg | ORAL_TABLET | Freq: Every day | ORAL | 0 refills | Status: DC
Start: 2019-06-18 — End: 2019-07-15

## 2019-06-18 MED ORDER — RIVAROXABAN (XARELTO) EDUCATION KIT FOR AFIB PATIENTS
PACK | Freq: Once | Status: AC
  Filled 2019-06-18: qty 1

## 2019-06-18 MED ORDER — SODIUM CHLORIDE 0.9 % IV BOLUS
1000.0000 mL | Freq: Once | INTRAVENOUS | Status: AC
  Administered 2019-06-18: 1000 mL via INTRAVENOUS

## 2019-06-18 MED ORDER — RIVAROXABAN 20 MG PO TABS
20.0000 mg | ORAL_TABLET | Freq: Once | ORAL | Status: AC
  Administered 2019-06-19: 20 mg via ORAL
  Filled 2019-06-18: qty 1

## 2019-06-18 MED ORDER — METOPROLOL TARTRATE 25 MG PO TABS
12.5000 mg | ORAL_TABLET | Freq: Once | ORAL | Status: AC
  Administered 2019-06-18: 12.5 mg via ORAL
  Filled 2019-06-18: qty 1

## 2019-06-18 MED ORDER — METOPROLOL TARTRATE 5 MG/5ML IV SOLN: 5.0000 mg | Freq: Once | INTRAVENOUS | Status: DC

## 2019-06-18 NOTE — ED Provider Notes (Addendum)
[facility] EMERGENCY DEPARTMENT Provider Note   CSN: [id number] Arrival date & time: 06/18/19  1643     History Chief Complaint  Patient presents with  . Tachycardia    Briana Kane is a 69 y.o. female.  69 yo F with a chief complaints of palpitations.  Patient states that yesterday when she was unloading her car from [employer] she realized that she had a racing heart.  It continued today and so she decided come to the emergency department.  Has a history of atrial fibrillation.  Was previously on Eliquis and Entresto and amiodarone.  She has gotten into the donut hole of her government insurance and no longer can afford those medications.  She also did not like the way they made her feel.  She states she would rather take natural herbs and supplements instead.  She has been taking some herbs that are supposed to thin her blood.  She denies cough congestion or fever denies nausea vomiting or diarrhea denies decreased oral intake denies chest pain or chest pressure denies abdominal pain.  Denies extremity swelling.  The history is provided by the patient.  Illness Severity:  Moderate Onset quality:  Gradual Duration:  2 days Timing:  Intermittent Progression:  Waxing and waning Chronicity:  New Associated symptoms: no abdominal pain, no chest pain, no congestion, no cough, no diarrhea, no fever, no headaches, no loss of consciousness, no myalgias, no nausea, no rhinorrhea, no shortness of breath, no vomiting and no wheezing        Past Medical History:  Diagnosis Date  . Acute systolic CHF (congestive heart failure) ([place]) 02/02/2016  . Atrial fibrillation with RVR ([place]) 01/29/2016  . Bilateral leg edema 01/29/2016  . Community acquired pneumonia 01/29/2016  . Dyspnea   . Generalized edema   . Morbid obesity with BMI of 45.0-49.9, adult ([place]) 02/02/2016    Patient Active Problem List   Diagnosis Date Noted  . Persistent atrial fibrillation ([place])   .  Cardiomyopathy, dilated ([place]) 02/19/2016  . Acute systolic CHF (congestive heart failure) ([place]) 02/02/2016  . Morbid obesity with BMI of 45.0-49.9, adult ([place]) 02/02/2016  . Generalized edema   . Dyspnea   . Atrial fibrillation with RVR ([place]) 01/29/2016  . Community acquired pneumonia 01/29/2016  . Bilateral leg edema 01/29/2016    Past Surgical History:  Procedure Laterality Date  . CARDIOVERSION N/A 02/01/2016   Procedure: CARDIOVERSION;  Surgeon: Thayer Headings, MD;  Location: [facility];  Service: Cardiovascular;  Laterality: N/A;  . CARDIOVERSION N/A 02/26/2016   Procedure: CARDIOVERSION;  Surgeon: Jerline Pain, MD;  Location: [facility] ENDOSCOPY;  Service: Cardiovascular;  Laterality: N/A;  . TEE WITHOUT CARDIOVERSION N/A 02/01/2016   Procedure: TRANSESOPHAGEAL ECHOCARDIOGRAM (TEE);  Surgeon: Thayer Headings, MD;  Location: [facility] ENDOSCOPY;  Service: Cardiovascular;  Laterality: N/A;     OB History   No obstetric history on file.     Family History  Problem Relation Age of Onset  . Hypertension Mother     Social History   Tobacco Use  . Smoking status: Former Smoker    Quit date: 01/29/1991    Years since quitting: 28.4  . Smokeless tobacco: Never Used  Substance Use Topics  . Alcohol use: No  . Drug use: No    Home Medications Prior to Admission medications   Medication Sig Start Date End Date Taking? Authorizing Provider  ALPRAZolam Duanne Moron) 0.5 MG tablet Take 1 tablet (0.5 mg total) by mouth  3 (three) times daily as needed for anxiety. 02/04/16   Rama, Venetia Maxon, MD  amiodarone (PACERONE) 200 MG tablet TAKE 1 TABLET BY MOUTH EVERY DAY 10/06/18   Camnitz, Ocie Doyne, MD  apixaban (ELIQUIS) 5 MG TABS tablet Take 1 tablet (5 mg total) by mouth 2 (two) times daily. 02/20/17   Camnitz, Ocie Doyne, MD  B Complex Vitamins (B COMPLEX PO) Take 1 tablet by mouth 2 (two) times daily.    [provider]  carvedilol (COREG) 6.25 MG tablet Take 1 tablet (6.25 mg total) by  mouth 2 (two) times daily. 10/02/17   Camnitz, Ocie Doyne, MD  COENZYME Q-10 PO Take 1 capsule by mouth 4 (four) times daily.    [provider]  ENTRESTO 97-103 MG TAKE 1 TABLET BY MOUTH TWICE DAILY 04/07/18   Camnitz, Ocie Doyne, MD  furosemide (LASIX) 40 MG tablet Take 1 tablet (40 mg total) by mouth 2 (two) times daily. 09/27/16   Camnitz, Ocie Doyne, MD  HAWTHORNE BERRY PO Take 1 capsule by mouth 2 (two) times daily.    [provider]  MAGNESIUM PO Take 1,000 mg by mouth daily.    [provider]  metoprolol succinate (TOPROL-XL) 25 MG 24 hr tablet Take 0.5 tablets (12.5 mg total) by mouth daily. 06/18/19   Deno Etienne, DO  Misc Natural Products (TURMERIC CURCUMIN) CAPS Take 2 capsules by mouth 2 (two) times daily.    [provider]  NIACIN PO Take 2 tablets by mouth 2 (two) times daily.    [provider]  NON FORMULARY Take 2 capsules by mouth daily. Taurine 1,000 mg    [provider]  OVER THE COUNTER MEDICATION Beet Root: Take 1 capsule by mouth four times a day    [provider]  OVER THE COUNTER MEDICATION Ashwaganda capsules: Take 2 capsules by mouth two times a day    [provider]  OVER THE COUNTER MEDICATION Moringa capsules: Take 2 capsules by mouth two times a day    [provider]  POTASSIUM CHLORIDE PO Take 1,000 mg by mouth daily.    [provider]  rivaroxaban (XARELTO) 20 MG TABS tablet Take 1 tablet (20 mg total) by mouth daily with supper. 06/18/19   Deno Etienne, DO    Allergies    Gluten meal, Wheat bran, Mold extract [trichophyton], Neosporin [neomycin-bacitracin zn-polymyx], Other, Petroleum jelly lip treatment [dimethicone], and Tape  Review of Systems   Review of Systems  Constitutional: Negative for chills and fever.  HENT: Negative for congestion and rhinorrhea.   Eyes: Negative for redness and visual disturbance.  Respiratory: Negative for cough, shortness of breath  and wheezing.   Cardiovascular: Positive for palpitations. Negative for chest pain.  Gastrointestinal: Negative for abdominal pain, diarrhea, nausea and vomiting.  Genitourinary: Negative for dysuria and urgency.  Musculoskeletal: Negative for arthralgias and myalgias.  Skin: Negative for pallor and wound.  Neurological: Negative for dizziness, loss of consciousness and headaches.    Physical Exam Updated Vital Signs BP (!) 137/108   Pulse (!) 134   Temp 98.4 F (36.9 C) (Oral)   Resp 16   Ht 5' 7.25" (1.708 m)   Wt 128.9 kg   SpO2 95%   BMI 44.18 kg/m   Physical Exam Vitals and nursing note reviewed.  Constitutional:      General: She is not in acute distress.    Appearance: She is well-developed. She is obese. She is not diaphoretic.  HENT:  Head: Normocephalic and atraumatic.  Eyes:     Pupils: Pupils are equal, round, and reactive to light.  Cardiovascular:     Rate and Rhythm: Tachycardia present. Rhythm irregular.     Heart sounds: No murmur. No friction rub. No gallop.   Pulmonary:     Effort: Pulmonary effort is normal.     Breath sounds: No wheezing or rales.  Abdominal:     General: There is no distension.     Palpations: Abdomen is soft.     Tenderness: There is no abdominal tenderness.  Musculoskeletal:        General: No tenderness.     Cervical back: Normal range of motion and neck supple.  Skin:    General: Skin is warm and dry.  Neurological:     Mental Status: She is alert and oriented to person, place, and time.  Psychiatric:        Behavior: Behavior normal.     ED Results / Procedures / Treatments   Labs (all labs ordered are listed, but only abnormal results are displayed) Labs Reviewed  BASIC METABOLIC PANEL - Abnormal; Notable for the following components:      Result Value   Glucose, Bld 154 (*)    All other components within normal limits  CBC - Abnormal; Notable for the following components:   RBC 5.15 (*)    Hemoglobin 15.2  (*)    HCT 46.1 (*)    All other components within normal limits  MAGNESIUM    EKG EKG Interpretation  Date/Time:  Friday Jun 18 2019 16:59:05 EDT Ventricular Rate:  82 PR Interval:  158 QRS Duration: 82 QT Interval:  368 QTC Calculation: 429 R Axis:   -70 Text Interpretation: Normal sinus rhythm Left axis deviation Septal infarct , age undetermined Possible Lateral infarct , age undetermined Abnormal ECG No significant change since last tracing Confirmed by Deno Etienne [phone]) on 06/18/2019 7:50:40 PM   Radiology No results found.  Procedures Procedures (including critical care time)  Medications Ordered in ED Medications  rivaroxaban (XARELTO) Education Kit for Afib patients (has no administration in time range)  sodium chloride 0.9 % bolus 1,000 mL (1,000 mLs Intravenous New Bag/Given 06/18/19 2243)  metoprolol tartrate (LOPRESSOR) tablet 12.5 mg (12.5 mg Oral Given 06/18/19 2245)  metoprolol tartrate (LOPRESSOR) injection 5 mg (5 mg Intravenous Given 06/18/19 2244)    ED Course  I have reviewed the triage vital signs and the nursing notes.  Pertinent labs & imaging results that were available during my care of the patient were reviewed by me and considered in my medical decision making (see chart for details).    MDM Rules/Calculators/A&P                      69 yo F with a chief complaints of palpitations.  Has a history of atrial fibrillation..  She has stopped her anticoagulation as well as her amiodarone.  I discussed with her different options including elective cardioversion here.  I expressed my reluctance that she actually was not in A. fib upon arrival to the ED and then spontaneously went into it while she was here.  If she is going in and out so rapidly it seems unlikely to be helpful to cardiovert her here.  She is also declining to have it done as is upset it done a couple times in the past and would not like it done again if possible.  I discussed with her  the  reasoning for being on an anticoagulant with atrial fibrillation.  We will give a dose of metoprolol here.  Check lab work to assess for electrolyte disturbance.  Bolus of IV fluids for possible dehydration.  Patient's heart rate has come down somewhat with IV metoprolol.  We will start her on this as an outpatient.  Have her follow-up with her cardiologist in the office.  We will switch her to Xarelto as I would assume that she would not have the ability to have the coupon for starting the medication.  We will give her time to follow-up with her cardiologist and discuss different options for anticoagulation if she cannot afford Eliquis.  CHA2DS2/VAS Stroke Risk Points  Current as of 7 minutes ago     3 >= 2 Points: High Risk  1 - 1.99 Points: Medium Risk  0 Points: Low Risk    This is the only CHA2DS2/VAS Stroke Risk Points available for the past  year.: Last Change: N/A     Details    This score determines the patient's risk of having a stroke if the  patient has atrial fibrillation.       Points Metrics  1 Has Congestive Heart Failure:  Yes    Current as of 7 minutes ago  0 Has Vascular Disease:  No    Current as of 7 minutes ago  0 Has Hypertension:  No    Current as of 7 minutes ago  1 Age:  76    Current as of 7 minutes ago  0 Has Diabetes:  No    Current as of 7 minutes ago  0 Had Stroke:  No  Had TIA:  No  Had thromboembolism:  No    Current as of 7 minutes ago  1 Female:  Yes    Current as of 7 minutes ago            11:16 PM:  I have discussed the diagnosis/risks/treatment options with the patient and believe the pt to be eligible for discharge home to follow-up with Cards. We also discussed returning to the ED immediately if new or worsening sx occur. We discussed the sx which are most concerning (e.g., chest pain, shortness of breath) that necessitate immediate return. Medications administered to the patient during their visit and any new prescriptions provided to the  patient are listed below.  Medications given during this visit Medications  rivaroxaban Alveda Reasons) Education Kit for Afib patients (has no administration in time range)  sodium chloride 0.9 % bolus 1,000 mL (1,000 mLs Intravenous New Bag/Given 06/18/19 2243)  metoprolol tartrate (LOPRESSOR) tablet 12.5 mg (12.5 mg Oral Given 06/18/19 2245)  metoprolol tartrate (LOPRESSOR) injection 5 mg (5 mg Intravenous Given 06/18/19 2244)     The patient appears reasonably screen and/or stabilized for discharge and I doubt any other medical condition or other [facility] requiring further screening, evaluation, or treatment in the ED at this time prior to discharge.   Final Clinical Impression(s) / ED Diagnoses Final diagnoses:  Atrial fibrillation with RVR ([place])    Rx / DC Orders ED Discharge Orders         Ordered    Amb referral to AFIB Clinic     06/18/19 2047    rivaroxaban (XARELTO) 20 MG TABS tablet  Daily with supper     06/18/19 2314    metoprolol succinate (TOPROL-XL) 25 MG 24 hr tablet  Daily     06/18/19 2314  Deno Etienne, DO 06/18/19 [place], [place], DO 06/18/19 2324

## 2019-06-18 NOTE — ED Triage Notes (Signed)
Pt reports she is here because her heart rate is elevated. Denies chest pain/shortness of breath. States she checks her heart rate regularly and it was high last night.

## 2019-06-18 NOTE — Discharge Instructions (Signed)
Follow up with your cardiologist, or call the other cardiologist listed if you would like to see if they can see you in the office.  Return for worsening symptoms, chest pain, shortness of breath.

## 2019-06-18 NOTE — ED Notes (Signed)
Pt c/o no complaints

## 2019-06-29 ENCOUNTER — Other Ambulatory Visit: Payer: Self-pay

## 2019-06-29 ENCOUNTER — Encounter (HOSPITAL_COMMUNITY): Payer: Self-pay | Admitting: Nurse Practitioner

## 2019-06-29 ENCOUNTER — Ambulatory Visit (HOSPITAL_COMMUNITY)
Admission: RE | Admit: 2019-06-29 | Discharge: 2019-06-29 | Disposition: A | Discharge status: Home / Self Care | Payer: PPO | Source: Ambulatory Visit | Attending: Nurse Practitioner | Admitting: Nurse Practitioner

## 2019-06-29 VITALS — BP 120/80 | HR 70 | Ht 67.25 in | Wt 288.0 lb

## 2019-06-29 DIAGNOSIS — D6869 Other thrombophilia: Secondary | ICD-10-CM

## 2019-06-29 DIAGNOSIS — Z8249 Family history of ischemic heart disease and other diseases of the circulatory system: Secondary | ICD-10-CM | POA: Diagnosis not present

## 2019-06-29 DIAGNOSIS — Z79899 Other long term (current) drug therapy: Secondary | ICD-10-CM | POA: Diagnosis not present

## 2019-06-29 DIAGNOSIS — Z87891 Personal history of nicotine dependence: Secondary | ICD-10-CM | POA: Diagnosis not present

## 2019-06-29 DIAGNOSIS — Z7901 Long term (current) use of anticoagulants: Secondary | ICD-10-CM | POA: Diagnosis not present

## 2019-06-29 DIAGNOSIS — Z888 Allergy status to other drugs, medicaments and biological substances status: Secondary | ICD-10-CM | POA: Diagnosis not present

## 2019-06-29 DIAGNOSIS — I4819 Other persistent atrial fibrillation: Secondary | ICD-10-CM

## 2019-06-29 DIAGNOSIS — Z8701 Personal history of pneumonia (recurrent): Secondary | ICD-10-CM | POA: Diagnosis not present

## 2019-06-29 DIAGNOSIS — I480 Paroxysmal atrial fibrillation: Secondary | ICD-10-CM | POA: Insufficient documentation

## 2019-06-29 DIAGNOSIS — I5021 Acute systolic (congestive) heart failure: Secondary | ICD-10-CM | POA: Insufficient documentation

## 2019-06-29 NOTE — Progress Notes (Signed)
Primary Care Physician: Hayden Rasmussen, MD Referring Physician: [facility] ER f/u   Briana Kane is a 69 y.o. female with a h/o afib that had been on amiodarone until one year ago when she stopped this drug as well as eliquis 2/2 cost issues and side effects. She was trying natural supplements. . She presented to the ER with palpitations and was found to have afib. She was given IV metoprolol and was seen to be in and out in the ER. She  was started on xarelto 20 mg daily and d/c home.   Today, she presented in SR but on ausculation she was in afib. She indicated to me that she did not want to go back to Dr. Curt Bears  as she did not like him and did not want to discuss ablation or change in antiarrythmic.  She  states that she has an appointment  with  a new internist in a couple of weeks and would discuss furhter.    Today, she denies symptoms of palpitations, chest pain, shortness of breath, orthopnea, PND, lower extremity edema, dizziness, presyncope, syncope, or neurologic sequela. The patient is tolerating medications without difficulties and is otherwise without complaint today.   Past Medical History:  Diagnosis Date  . Acute systolic CHF (congestive heart failure) ([place]) 02/02/2016  . Atrial fibrillation with RVR ([place]) 01/29/2016  . Bilateral leg edema 01/29/2016  . Community acquired pneumonia 01/29/2016  . Dyspnea   . Generalized edema   . Morbid obesity with BMI of 45.0-49.9, adult ([place]) 02/02/2016   Past Surgical History:  Procedure Laterality Date  . CARDIOVERSION N/A 02/01/2016   Procedure: CARDIOVERSION;  Surgeon: Thayer Headings, MD;  Location: [place] [facility];  Service: Cardiovascular;  Laterality: N/A;  . CARDIOVERSION N/A 02/26/2016   Procedure: CARDIOVERSION;  Surgeon: Jerline Pain, MD;  Location: [facility] ENDOSCOPY;  Service: Cardiovascular;  Laterality: N/A;  . TEE WITHOUT CARDIOVERSION N/A 02/01/2016   Procedure: TRANSESOPHAGEAL ECHOCARDIOGRAM (TEE);  Surgeon: Thayer Headings, MD;   Location: [facility] ENDOSCOPY;  Service: Cardiovascular;  Laterality: N/A;    Current Outpatient Medications  Medication Sig Dispense Refill  . Ascorbic Acid (VITAMIN C) 1000 MG tablet Take 2,000 mg by mouth daily.    . B Complex Vitamins (B COMPLEX PO) Take 1 tablet by mouth 2 (two) times daily.    Marland Kitchen COENZYME Q-10 PO Take 1 capsule by mouth 4 (four) times daily.    Marland Kitchen HAWTHORNE BERRY PO Take 1 capsule by mouth 2 (two) times daily.    Marland Kitchen MAGNESIUM PO Take 1,000 mg by mouth daily.    . metoprolol succinate (TOPROL-XL) 25 MG 24 hr tablet Take 0.5 tablets (12.5 mg total) by mouth daily. 15 tablet 0  . Misc Natural Products (TURMERIC CURCUMIN) CAPS Take 2 capsules by mouth 2 (two) times daily.    . NON FORMULARY Take 2 capsules by mouth daily. Taurine 1,000 mg    . OVER THE COUNTER MEDICATION Beet Root: Take 1 capsule by mouth four times a day    . OVER THE COUNTER MEDICATION Ashwaganda capsules: Take 2 capsules by mouth two times a day    . POTASSIUM CHLORIDE PO Take 1 tablet by mouth daily. Not sure the dose    . rivaroxaban (XARELTO) 20 MG TABS tablet Take 1 tablet (20 mg total) by mouth daily with supper. 30 tablet 0   No current facility-administered medications for this encounter.    Allergies  Allergen Reactions  . Gluten Meal Shortness Of  Breath and Other (See Comments)    Triggered fluid around her heart a couple of years ago (NO BREAD OR PASTA)  . Wheat Bran Shortness Of Breath    Also, possible edema (around heart)  . 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 Mold Extract [Trichophyton] Other (See Comments)    Causes congestion (sudden)  . Neosporin [Neomycin-Bacitracin Zn-Polymyx] Rash  . Other Rash    Neoprene (NO!)  . Petroleum Jelly Lip Treatment [Dimethicone] Rash  . Tape Rash    Social History   Socioeconomic History  . Marital status: Single    Spouse name: Not on file  . Number of children: Not on file  . Years of education: Not on file    . Highest education level: Not on file  Occupational History  . Not on file  Tobacco Use  . Smoking status: Former Smoker    Quit date: 01/29/1991    Years since quitting: 28.4  . Smokeless tobacco: Never Used  Substance and Sexual Activity  . Alcohol use: No  . Drug use: No  . Sexual activity: Not on file  Other Topics Concern  . Not on file  Social History Narrative  . Not on file   Social Determinants of Health   Financial Resource Strain:   . Difficulty of Paying Living Expenses:   Food Insecurity:   . Worried About Programme researcher, broadcasting/film/video in the Last Year:   . Barista in the Last Year:   Transportation Needs:   . Freight forwarder (Medical):   Marland Kitchen Lack of Transportation (Non-Medical):   Physical Activity:   . Days of Exercise per Week:   . Minutes of Exercise per Session:   Stress:   . Feeling of Stress :   Social Connections:   . Frequency of Communication with Friends and Family:   . Frequency of Social Gatherings with Friends and Family:   . Attends Religious Services:   . Active Member of Clubs or Organizations:   . Attends Banker Meetings:   Marland Kitchen Marital Status:   Intimate Partner Violence:   . Fear of Current or Ex-Partner:   . Emotionally Abused:   Marland Kitchen Physically Abused:   . Sexually Abused:     Family History  Problem Relation Age of Onset  . Hypertension Mother     ROS- All systems are reviewed and negative except as per the HPI above  Physical Exam: Vitals:   06/29/19 1517  BP: 120/80  Pulse: 70  Weight: 130.6 kg  Height: 5' 7.25" (1.708 m)   Wt Readings from Last 3 Encounters:  06/29/19 130.6 kg  06/18/19 128.9 kg  09/10/17 132.7 kg    Labs: Lab Results  Component Value Date   NA 139 06/18/2019   K 4.7 06/18/2019   CL 103 06/18/2019   CO2 23 06/18/2019   GLUCOSE 154 (H) 06/18/2019   BUN 13 06/18/2019   CREATININE 0.84 06/18/2019   CALCIUM 9.5 06/18/2019   MG 2.0 06/18/2019   Lab Results  Component Value  Date   INR 1.2 02/19/2016   Lab Results  Component Value Date   CHOL 107 01/30/2016   HDL 41 01/30/2016   LDLCALC 54 01/30/2016   TRIG 61 01/30/2016     GEN- The patient is well appearing, alert and oriented x 3 today.   Head- normocephalic, atraumatic Eyes-  Sclera clear, conjunctiva pink Ears- hearing intact Oropharynx- clear  Neck- supple, no JVP Lymph- no cervical lymphadenopathy Lungs- Clear to ausculation bilaterally, normal work of breathing Heart- irregular rate and rhythm, no murmurs, rubs or gallops, PMI not laterally displaced GI- soft, NT, ND, + BS Extremities- no clubbing, cyanosis, or edema MS- no significant deformity or atrophy Skin- no rash or lesion Psych- euthymic mood, full affect Neuro- strength and sensation are intact  EKG-sinus rhythm at 70 bpm,LAFB on presentation but was in afib with rvr on ausculation     Assessment and Plan: 1. Paroxysmal afib Pt did not seem open to discuss any options to maintain SR Offered a f/u with Dr. Elberta Fortis but she does not want to return to him, not interested ibn change in AAD or ablation  States she gets stressed with doctor appointment and that is why she feels she went back into afib I advised to go home and take an extra metoprolol  Continue  12.5 mg daily of metoprolol succinate   She plans to establish with a new PCP in 2 weeks and will discuss further with that person   2. CHA2DS2VASc score of at least 3 Continue xarleto 20 mg daily   afib clinic as needed   Lupita Leash C. [facility] [address] [place], [place] [address] [phone]

## 2019-07-15 ENCOUNTER — Other Ambulatory Visit: Payer: Self-pay

## 2019-07-15 ENCOUNTER — Encounter: Payer: Self-pay | Admitting: Cardiology

## 2019-07-15 ENCOUNTER — Ambulatory Visit: Payer: PPO | Admitting: Cardiology

## 2019-07-15 VITALS — BP 182/86 | HR 58 | Resp 17 | Ht 67.0 in | Wt 288.3 lb

## 2019-07-15 DIAGNOSIS — Z87891 Personal history of nicotine dependence: Secondary | ICD-10-CM

## 2019-07-15 DIAGNOSIS — I10 Essential (primary) hypertension: Secondary | ICD-10-CM

## 2019-07-15 DIAGNOSIS — Z7901 Long term (current) use of anticoagulants: Secondary | ICD-10-CM | POA: Diagnosis not present

## 2019-07-15 DIAGNOSIS — Z6841 Body Mass Index (BMI) 40.0 and over, adult: Secondary | ICD-10-CM | POA: Diagnosis not present

## 2019-07-15 DIAGNOSIS — I480 Paroxysmal atrial fibrillation: Secondary | ICD-10-CM

## 2019-07-15 MED ORDER — LOSARTAN POTASSIUM 25 MG PO TABS: 25.0000 mg | ORAL_TABLET | Freq: Every morning | ORAL | 0 refills | Status: DC

## 2019-07-15 MED ORDER — APIXABAN 5 MG PO TABS: 5.0000 mg | ORAL_TABLET | Freq: Two times a day (BID) | ORAL | 0 refills | Status: DC

## 2019-07-15 NOTE — Progress Notes (Signed)
Date:  07/15/2019   ID:  Briana Kane, DOB [birth year], MRN [id number]  PCP:  Dois Davenport, MD  Cardiologist:  Tessa Lerner, DO, [facility] (established care 07/15/2019) Former Cardiology Providers: Dr. Clifton James (consult 01/2016) Electrophysiologist: Dr. Elberta Fortis  REASON FOR CONSULT: Atrial Fibrillation.   REQUESTING PHYSICIAN:  Dois Davenport, MD [address] [place],  [place] [address]  Chief Complaint  Patient presents with  . Atrial Fibrillation  . New Patient (Initial Visit)    HPI  Briana Kane is a 69 y.o. female who presents to the office with a chief complaint of " atrial fibrillation management." She is referred to the office at the request of Dois Davenport, MD. Patient's past medical history and cardiovascular risk factors include: Atrial fibrillation status post cardioversion x2, hypertension, former smoker, postmenopausal female, advanced age, obesity due to excess calories.  Patient was under the care of multiple cardiovascular physicians in the past and is here to reestablish care and was referred to the office by the ER at [facility].  It appears that patient had atrial fibrillation diagnosed back in 2018 during which time she also was noted to have heart failure with reduced EF most likely secondary to tachycardia induced and A. fib with RVR.  Patient has undergone cardioversion x2 and at today's office visit EKG shows normal sinus rhythm.  She is on metoprolol for rate control and Xarelto for thromboembolic prophylaxis.  Patient would like to transition to Eliquis as Xarelto is expensive and she will be getting Eliquis possibly from [place].  Review of her chart also notes that she was on amiodarone but she stopped taking the medication approximately 1 year ago secondary to neuropathy.  No prior history of atrial fibrillation ablation.  She was under the care of her primary physician but has not seen them in approximately 2 years.   Patient is encouraged to go for physical and also be screened for diabetes and hyperlipidemia.  She may also benefit from sleep evaluation to rule out an underlying obstructive sleep apnea.  No family history of premature coronary artery disease or sudden cardiac death.  History of congestive heart failure.  Denies prior history of coronary artery disease, myocardial infarction, deep venous thrombosis, pulmonary embolism, stroke, transient ischemic attack.  FUNCTIONAL STATUS: No structured exercise program or daily routine.    ALLERGIES: Allergies  Allergen Reactions  . Gluten Meal Shortness Of Breath and Other (See Comments)    Triggered fluid around her heart a couple of years ago (NO BREAD OR PASTA)  . Wheat Bran Shortness Of Breath    Also, possible edema (around heart)  . 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 Mold Extract [Trichophyton] Other (See Comments)    Causes congestion (sudden)  . Neosporin [Neomycin-Bacitracin Zn-Polymyx] Rash  . Other Rash    Neoprene (NO!)  . Petroleum Jelly Lip Treatment [Dimethicone] Rash  . Tape Rash    MEDICATION LIST PRIOR TO VISIT: Current Meds  Medication Sig  . Ascorbic Acid (VITAMIN C) 1000 MG tablet Take 2,000 mg by mouth daily.  . B Complex Vitamins (B COMPLEX PO) Take 1 tablet by mouth 2 (two) times daily.  Marland Kitchen COENZYME Q-10 PO Take 1 capsule by mouth 4 (four) times daily.  Marland Kitchen HAWTHORNE BERRY PO Take 1 capsule by mouth 2 (two) times daily.  Marland Kitchen MAGNESIUM PO Take 1,000 mg by mouth daily.  . metoprolol succinate (TOPROL-XL) 25  MG 24 hr tablet Take 0.5 tablets (12.5 mg total) by mouth daily.  . Misc Natural Products (TURMERIC CURCUMIN) CAPS Take 2 capsules by mouth 2 (two) times daily.  . NON FORMULARY Take 2 capsules by mouth daily. Taurine 1,000 mg  . OVER THE COUNTER MEDICATION Beet Root: Take 1 capsule by mouth four times a day  . OVER THE COUNTER MEDICATION Ashwaganda capsules: Take 2 capsules by mouth  two times a day  . POTASSIUM CHLORIDE PO Take 1 tablet by mouth daily. Not sure the dose  . [DISCONTINUED] rivaroxaban (XARELTO) 20 MG TABS tablet Take 1 tablet (20 mg total) by mouth daily with supper.     PAST MEDICAL HISTORY: Past Medical History:  Diagnosis Date  . Acute systolic CHF (congestive heart failure) (HCC) 02/02/2016  . Atrial fibrillation with RVR (HCC) 01/29/2016  . Community acquired pneumonia 01/29/2016  . Dyspnea   . Hypertension   . Morbid obesity with BMI of 45.0-49.9, adult (HCC) 02/02/2016    PAST SURGICAL HISTORY: Past Surgical History:  Procedure Laterality Date  . CARDIOVERSION N/A 02/01/2016   Procedure: CARDIOVERSION;  Surgeon: [name], MD;  Location: [facility] ENDOSCOPY;  Service: Cardiovascular;  Laterality: N/A;  . CARDIOVERSION N/A 02/26/2016   Procedure: CARDIOVERSION;  Surgeon: Jake Bathe, MD;  Location: [facility] ENDOSCOPY;  Service: Cardiovascular;  Laterality: N/A;  . TEE WITHOUT CARDIOVERSION N/A 02/01/2016   Procedure: TRANSESOPHAGEAL ECHOCARDIOGRAM (TEE);  Surgeon: [name], MD;  Location: [facility] ENDOSCOPY;  Service: Cardiovascular;  Laterality: N/A;    FAMILY HISTORY: The patient family history includes Hypertension in her mother.  SOCIAL HISTORY:  The patient  reports that she quit smoking about 28 years ago. She has never used smokeless tobacco. She reports that she does not drink alcohol and does not use drugs.  REVIEW OF SYSTEMS: Review of Systems  Constitutional: Negative for chills and fever.  HENT: Negative for hoarse voice and nosebleeds.   Eyes: Negative for discharge, double vision and pain.  Cardiovascular: Negative for chest pain, claudication, dyspnea on exertion, leg swelling, near-syncope, orthopnea, palpitations, paroxysmal nocturnal dyspnea and syncope.  Respiratory: Negative for hemoptysis and shortness of breath.   Musculoskeletal: Negative for muscle cramps and myalgias.  Gastrointestinal: Negative for abdominal pain,  constipation, diarrhea, hematemesis, hematochezia, melena, nausea and vomiting.  Neurological: Negative for dizziness and light-headedness.    PHYSICAL EXAM: Vitals with BMI 07/15/2019 06/29/2019 06/19/2019  Height 5\' 7"  5' 7.25" -  Weight 288 lbs 5 oz 288 lbs -  BMI 45.14 44.78 -  Systolic 182 120  Diastolic 86 80 74  Pulse 58 70 68    CONSTITUTIONAL: Well-developed and well-nourished. No acute distress.  SKIN: Skin is warm and dry. No rash noted. No cyanosis. No pallor. No jaundice HEAD: Normocephalic and atraumatic.  EYES: No scleral icterus MOUTH/THROAT: Moist oral membranes.  NECK: No JVD present. No thyromegaly noted. No carotid bruits  LYMPHATIC: No visible cervical adenopathy.  CHEST Normal respiratory effort. No intercostal retractions  LUNGS: Clear to auscultation bilaterally.  No stridor. No wheezes. No rales.  CARDIOVASCULAR: Regular rate and rhythm, positive S1-S2, no murmurs rubs or gallops appreciated ABDOMINAL: Obese, soft, nontender, distended, positive bowel sounds in all 4 quadrants.  No apparent ascites.  EXTREMITIES: No peripheral edema  HEMATOLOGIC: No significant bruising NEUROLOGIC: Oriented to person, place, and time. Nonfocal. Normal muscle tone.  PSYCHIATRIC: Normal mood and affect. Normal behavior. Cooperative  CARDIAC DATABASE: EKG: 07/15/2019: Sinus  Rhythm, LAFB, left axis deviation, old anteroseptal  infarct, poor R wave progression, without underlying ischemia or injury pattern.  Echocardiogram: 1/101/2018: LVEF severely reduced, 25-30%, diffuse hypokinesis, moderate MR, moderately dilated left atrium, mildly dilated RV with reduced systolic function, moderately dilated right atrium, moderate TR.  06/24/2016: LVEF 55%, normal wall motion, elevated LVEDP and LAP, mildly dilated left atrium.  Stress Testing: None  Heart Catheterization: None  LABORATORY DATA: CBC Latest Ref Rng & Units 06/18/2019 02/19/2016 02/04/2016  WBC 4.0 - 10.5 K/uL 7.9  7.2 7.7  Hemoglobin 12.0 - 15.0 g/dL 15.2(H) 15.5 15.0  Hematocrit 36 - 46 % 46.1(H) 47.6(H) 46.3(H)  Platelets 150 - 400 K/uL 258 245 214    CMP Latest Ref Rng & Units 06/18/2019 05/08/2016 04/24/2016  Glucose 70 - 99 mg/dL 154(H) 98 105(H)  BUN 8 - 23 mg/dL 13 19 26   Creatinine 0.44 - 1.00 mg/dL 0.84 0.96 0.97  Sodium 135 - 145 mmol/L 139 141 139  Potassium 3.5 - 5.1 mmol/L 4.7 4.9 4.3  Chloride 98 - 111 mmol/L 103 98 96  CO2 22 - 32 mmol/L 23 25 26   Calcium 8.9 - 10.3 mg/dL 9.5 9.5 9.3  Total Protein 6.5 - 8.1 g/dL - - -  Total Bilirubin 0.3 - 1.2 mg/dL - - -  Alkaline Phos 38 - 126 U/L - - -  AST 15 - 41 U/L - - -  ALT 14 - 54 U/L - - -    Lipid Panel     Component Value Date/Time   CHOL 107 01/30/2016 0229   TRIG 61 01/30/2016 0229   HDL 41 01/30/2016 0229   CHOLHDL 2.6 01/30/2016 0229   VLDL 12 01/30/2016 0229   LDLCALC 54 01/30/2016 0229    No components found for: NTPROBNP No results for input(s): PROBNP in the last 8760 hours. No results for input(s): TSH in the last 8760 hours.  BMP Recent Labs    06/18/19 1730  NA 139  K 4.7  CL 103  CO2 23  GLUCOSE 154*  BUN 13  CREATININE 0.84  CALCIUM 9.5  GFRNONAA >60  GFRAA >60    HEMOGLOBIN A1C Lab Results  Component Value Date   HGBA1C 6.0 (H) 01/30/2016   MPG 126 01/30/2016    IMPRESSION:    ICD-10-CM   1. Paroxysmal atrial fibrillation (HCC)  I48.0 EKG 12-Lead    PCV ECHOCARDIOGRAM COMPLETE    PCV MYOCARDIAL PERFUSION WITH LEXISCAN    TSH    Pro b natriuretic peptide (BNP)  2. Long term (current) use of anticoagulants  Z79.01 apixaban (ELIQUIS) 5 MG TABS tablet  3. Benign hypertension  I10 losartan (COZAAR) 25 MG tablet    Basic metabolic panel    Magnesium  4. Class 3 severe obesity due to excess calories with serious comorbidity and body mass index (BMI) of 45.0 to 49.9 in adult (HCC)  E66.01    Z68.42   5. Former smoker  Z87.891      RECOMMENDATIONS: JAYLYNE BREESE is a 69 y.o.  female whose past medical history and cardiac risk factors include: Atrial fibrillation status post cardioversion x2, hypertension, former smoker, postmenopausal female, advanced age, obesity due to excess calories.  Paroxysmal atrial fibrillation: Currently normal sinus  Rate control: Metoprolol.  Rhythm control: N/A  Thromboembolic prophylaxis: Xarelto, patient would like to be transitioned to Eliquis due to medication cost and she plans to get Eliquis from [place].   Eliquis prescription sent to her pharmacy.  Patient had history of cardioversion x2.  And no prior  history of atrial fibrillation ablation.  CHA2DS2-VASc SCORE is 3 which correlates to 3.2% risk of stroke per year (hypertension, age, female gender).  Echocardiogram will be ordered to evaluate for structural heart disease and left ventricular systolic function.  Nuclear stress test recommended to evaluate for reversible ischemia.  Check TSH and BNP  Long-term oral anticoagulation:  Patient has been educated on the importance of monitoring for evidence of bleeding which includes but not limited to hemoptysis, hematochezia, melanotic stools, and hematuria. Patient educated on fall precautions and if she was to be injured despite the mechanism of injury she is to seek medical attention at the closest ER since she is on blood thinners.  Patient understands importance of this because if internal bleeding if not treated in a timely manner it may further lead to morbidity and/or mortality.  Patient voices understanding of these recommendations and provides verbal feedback.  Patient does not endorse any evidence of bleeding.  Benign essential hypertension:  Patient's blood pressure at the office visit is elevated.  Recommend initiation of losartan 25 mg p.o. daily.  Also recommended her to keep a blood pressure log to bring in at next office visit with either myself or her PCP to see if additional medication titration is  warranted.  Plan BMP and magnesium level in 1 week to evaluate kidney function electrolytes.  Obesity, due to excess calories: Body mass index is 45.15 kg/m. . I reviewed with the patient the importance of diet, regular physical activity/exercise, weight loss.   . Patient is educated on increasing physical activity gradually as tolerated.  With the goal of moderate intensity exercise for 30 minutes a day 5 days a week.  Patient educated on the importance of addressing her modifiable cardiovascular risk factors.  She is educated on reestablishing care with her primary care provider for her yearly physical and to be screened for diabetes and hypertension.  Also recommended obtaining a referral for sleep study to evaluate for sleep apnea.  Total encounter time 60 minutes.  *Total Encounter Time as defined by the Centers for Medicare and Medicaid Services includes, in addition to the face-to-face time of a patient visit (documented in the note above) non-face-to-face time: obtaining and reviewing prior notes and history, ordering and reviewing medications, tests or procedures, care coordination.  FINAL MEDICATION LIST END OF ENCOUNTER: Meds ordered this encounter  Medications  . losartan (COZAAR) 25 MG tablet    Sig: Take 1 tablet (25 mg total) by mouth in the morning.    Dispense:  90 tablet    Refill:  0  . apixaban (ELIQUIS) 5 MG TABS tablet    Sig: Take 1 tablet (5 mg total) by mouth 2 (two) times daily.    Dispense:  60 tablet    Refill:  0    Medications Discontinued During This Encounter  Medication Reason  . rivaroxaban (XARELTO) 20 MG TABS tablet Patient Preference     Current Outpatient Medications:  .  Ascorbic Acid (VITAMIN C) 1000 MG tablet, Take 2,000 mg by mouth daily., Disp: , Rfl:  .  B Complex Vitamins (B COMPLEX PO), Take 1 tablet by mouth 2 (two) times daily., Disp: , Rfl:  .  COENZYME Q-10 PO, Take 1 capsule by mouth 4 (four) times daily., Disp: , Rfl:  .   HAWTHORNE BERRY PO, Take 1 capsule by mouth 2 (two) times daily., Disp: , Rfl:  .  MAGNESIUM PO, Take 1,000 mg by mouth daily., Disp: , Rfl:  .  metoprolol succinate (TOPROL-XL) 25 MG 24 hr tablet, Take 0.5 tablets (12.5 mg total) by mouth daily., Disp: 15 tablet, Rfl: 0 .  Misc Natural Products (TURMERIC CURCUMIN) CAPS, Take 2 capsules by mouth 2 (two) times daily., Disp: , Rfl:  .  NON FORMULARY, Take 2 capsules by mouth daily. Taurine 1,000 mg, Disp: , Rfl:  .  OVER THE COUNTER MEDICATION, Beet Root: Take 1 capsule by mouth four times a day, Disp: , Rfl:  .  OVER THE COUNTER MEDICATION, Ashwaganda capsules: Take 2 capsules by mouth two times a day, Disp: , Rfl:  .  POTASSIUM CHLORIDE PO, Take 1 tablet by mouth daily. Not sure the dose, Disp: , Rfl:  .  apixaban (ELIQUIS) 5 MG TABS tablet, Take 1 tablet (5 mg total) by mouth 2 (two) times daily., Disp: 60 tablet, Rfl: 0 .  losartan (COZAAR) 25 MG tablet, Take 1 tablet (25 mg total) by mouth in the morning., Disp: 90 tablet, Rfl: 0  Orders Placed This Encounter  Procedures  . TSH  . Pro b natriuretic peptide (BNP)  . Basic metabolic panel  . Magnesium  . PCV MYOCARDIAL PERFUSION WITH LEXISCAN  . EKG 12-Lead  . PCV ECHOCARDIOGRAM COMPLETE    There are no Patient Instructions on file for this visit.   --Continue cardiac medications as reconciled in final medication list. --Return in about 2 months (around 09/14/2019) for afib follow up.. Or sooner if needed. --Continue follow-up with your primary care physician regarding the management of your other chronic comorbid conditions.  Patient's questions and concerns were addressed to her satisfaction. She voices understanding of the instructions provided during this encounter.   This note was created using a voice recognition software as a result there may be grammatical errors inadvertently enclosed that do not reflect the nature of this encounter. Every attempt is made to correct such  errors.  Tessa Lerner, [place], [facility]  Pager: [phone] Office: [phone]

## 2019-07-23 ENCOUNTER — Other Ambulatory Visit: Payer: Self-pay

## 2019-07-23 ENCOUNTER — Ambulatory Visit: Payer: PPO

## 2019-07-23 DIAGNOSIS — I480 Paroxysmal atrial fibrillation: Secondary | ICD-10-CM | POA: Diagnosis not present

## 2019-07-27 ENCOUNTER — Other Ambulatory Visit: Payer: PPO

## 2019-07-30 DIAGNOSIS — I10 Essential (primary) hypertension: Secondary | ICD-10-CM | POA: Diagnosis not present

## 2019-07-30 DIAGNOSIS — I480 Paroxysmal atrial fibrillation: Secondary | ICD-10-CM | POA: Diagnosis not present

## 2019-07-31 LAB — BASIC METABOLIC PANEL
BUN/Creatinine Ratio: 22 (ref 12–28)
BUN: 18 mg/dL (ref 8–27)
CO2: 22 mmol/L (ref 20–29)
Calcium: 9.6 mg/dL (ref 8.7–10.3)
Chloride: 100 mmol/L (ref 96–106)
Creatinine, Ser: 0.82 mg/dL (ref 0.57–1.00)
GFR calc Af Amer: 86 mL/min/{1.73_m2} (ref >59)
GFR calc non Af Amer: 74 mL/min/{1.73_m2} (ref >59)
Glucose: 100 mg/dL — ABNORMAL HIGH (ref 65–99)
Potassium: 4.6 mmol/L (ref 3.5–5.2)
Sodium: 139 mmol/L (ref 134–144)

## 2019-07-31 LAB — MAGNESIUM: Magnesium: 2.2 mg/dL (ref 1.6–2.3)

## 2019-07-31 LAB — PRO B NATRIURETIC PEPTIDE: NT-Pro BNP: 327 pg/mL — ABNORMAL HIGH (ref 0–301)

## 2019-07-31 LAB — TSH: TSH: 2.83 u[IU]/mL (ref 0.450–4.500)

## 2019-08-06 ENCOUNTER — Telehealth: Payer: Self-pay

## 2019-08-06 NOTE — Telephone Encounter (Signed)
LMTCB

## 2019-08-06 NOTE — Telephone Encounter (Signed)
-----   Message from [place], [place] sent at 08/06/2019  4:25 PM EDT ----- Results reviewed.Serum potassium and magnesium levels are within normal limits.Kidney function is relatively at baseline. TSH within limits.NT proBNP within age based limit.  Please have her complete the stress test before follow up Continue current medical therapy.Call if questions arise.

## 2019-08-11 NOTE — Telephone Encounter (Signed)
Left detailed voicemail. Instructing patient to call back with any questions

## 2019-08-19 ENCOUNTER — Other Ambulatory Visit: Payer: Self-pay

## 2019-08-19 DIAGNOSIS — Z7901 Long term (current) use of anticoagulants: Secondary | ICD-10-CM

## 2019-08-19 MED ORDER — APIXABAN 5 MG PO TABS: 5.0000 mg | ORAL_TABLET | Freq: Two times a day (BID) | ORAL | 0 refills | Status: DC

## 2019-09-17 ENCOUNTER — Ambulatory Visit: Payer: PPO | Admitting: Cardiology

## 2019-09-23 ENCOUNTER — Other Ambulatory Visit: Payer: Self-pay

## 2019-09-23 ENCOUNTER — Encounter: Payer: Self-pay | Admitting: Cardiology

## 2019-09-23 ENCOUNTER — Ambulatory Visit: Payer: PPO | Admitting: Cardiology

## 2019-09-23 VITALS — BP 138/89 | HR 76 | Resp 16 | Ht 67.0 in | Wt 284.0 lb

## 2019-09-23 DIAGNOSIS — Z87891 Personal history of nicotine dependence: Secondary | ICD-10-CM

## 2019-09-23 DIAGNOSIS — I10 Essential (primary) hypertension: Secondary | ICD-10-CM | POA: Diagnosis not present

## 2019-09-23 DIAGNOSIS — Z6841 Body Mass Index (BMI) 40.0 and over, adult: Secondary | ICD-10-CM

## 2019-09-23 DIAGNOSIS — I480 Paroxysmal atrial fibrillation: Secondary | ICD-10-CM | POA: Diagnosis not present

## 2019-09-23 DIAGNOSIS — Z7901 Long term (current) use of anticoagulants: Secondary | ICD-10-CM | POA: Diagnosis not present

## 2019-09-23 MED ORDER — METOPROLOL SUCCINATE ER 25 MG PO TB24: 25.0000 mg | ORAL_TABLET | Freq: Every day | ORAL | 1 refills | Status: DC

## 2019-09-23 MED ORDER — LOSARTAN POTASSIUM 25 MG PO TABS: 25.0000 mg | ORAL_TABLET | Freq: Every morning | ORAL | 0 refills | Status: DC

## 2019-09-23 MED ORDER — APIXABAN 5 MG PO TABS: 5.0000 mg | ORAL_TABLET | Freq: Two times a day (BID) | ORAL | 0 refills | Status: DC

## 2019-09-23 NOTE — Progress Notes (Signed)
ID:  Briana Kane, DOB Dec 19, 1950, MRN [id number]  PCP:  Dois Davenport, MD  Cardiologist:  Tessa Lerner, DO, [facility] (established care 07/15/2019) Former Cardiology Providers: Dr. Clifton James (consult 01/2016) Electrophysiologist: Dr. Elberta Fortis  Date: 09/23/19 Last Office Visit: 07/15/2019  Chief Complaint  Patient presents with  . Atrial Fibrillation  . Follow-up    2 month    HPI  Briana Kane is a 69 y.o. female who presents to the office with a chief complaint of " atrial fibrillation management." Patient's past medical history and cardiovascular risk factors include: Atrial fibrillation status post cardioversion x2, hypertension, history of congestive heart failure, former smoker, postmenopausal female, advanced age, obesity due to excess calories.  Patient was under the care of multiple cardiovascular physicians in the past and is here to reestablish care and was referred to the office by the ER at [facility] for management of atrial fibrillation.  It appears that patient had atrial fibrillation diagnosed back in 2018 during which time she also was noted to have heart failure with reduced EF most likely secondary to tachycardia induced and A. fib with RVR.  Patient has undergone cardioversion x2 and at today's office visit EKG shows normal sinus rhythm.  Possibly metoprolol for rate control but chose not to start it as she was concerned that similar to amiodarone.  Currently on Eliquis for thromboembolic prophylaxis.  Patient does not endorse any evidence of bleeding.    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  No prior history of atrial fibrillation ablation.  Since last office visit patient has establish care with  PCP as recommended.  Given her history of congestive heart failure and atrial fibrillation recommended stress test for further stratification for CAD.  However, patient does  not want to have stress test done because it involves radiation.  Patient also does not want to have coronary CTA for several reasons.  No family history of premature coronary artery disease or sudden cardiac death.  FUNCTIONAL STATUS: No structured exercise program or daily routine.    ALLERGIES: Allergies  Allergen Reactions  . Gluten Meal Shortness Of Breath and Other (See Comments)    Triggered fluid around her heart a couple of years ago (NO BREAD OR PASTA)  . Wheat Bran Shortness Of Breath    Also, possible edema (around heart)  . 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 Mold Extract [Trichophyton] Other (See Comments)    Causes congestion (sudden)  . Neosporin [Neomycin-Bacitracin Zn-Polymyx] Rash  . Other Rash    Neoprene (NO!)  . Petroleum Jelly Lip Treatment [Dimethicone] Rash  . Tape Rash    MEDICATION LIST PRIOR TO VISIT: Current Meds  Medication Sig  . apixaban (ELIQUIS) 5 MG TABS tablet Take 1 tablet (5 mg total) by mouth 2 (two) times daily.  . Ascorbic Acid (VITAMIN C) 1000 MG tablet Take 2,000 mg by mouth daily.  . B Complex Vitamins (B COMPLEX PO) Take 1 tablet by mouth 2 (two) times daily.  Marland Kitchen COENZYME Q-10 PO Take 1 capsule by mouth 4 (four) times daily.  Marland Kitchen HAWTHORNE BERRY PO Take 1 capsule by mouth 2 (two) times daily.  Marland Kitchen losartan (COZAAR) 25 MG tablet Take 1 tablet (25 mg total) by mouth in the morning.  Marland Kitchen MAGNESIUM PO Take 1,000 mg by mouth daily.  . Misc Natural Products (TURMERIC CURCUMIN) CAPS  Take 2 capsules by mouth 2 (two) times daily.  . NON FORMULARY Take 2 capsules by mouth daily. Taurine 1,000 mg  . OVER THE COUNTER MEDICATION Beet Root: Take 1 capsule by mouth four times a day  . OVER THE COUNTER MEDICATION Ashwaganda capsules: Take 2 capsules by mouth two times a day  . POTASSIUM CHLORIDE PO Take 1 tablet by mouth daily. Not sure the dose  . [DISCONTINUED] apixaban (ELIQUIS) 5 MG TABS tablet Take 1 tablet (5 mg  total) by mouth 2 (two) times daily.  . [DISCONTINUED] losartan (COZAAR) 25 MG tablet Take 1 tablet (25 mg total) by mouth in the morning.  . [DISCONTINUED] metoprolol succinate (TOPROL-XL) 25 MG 24 hr tablet Take 0.5 tablets (12.5 mg total) by mouth daily.     PAST MEDICAL HISTORY: Past Medical History:  Diagnosis Date  . Acute systolic CHF (congestive heart failure) (HCC) 02/02/2016  . Atrial fibrillation with RVR (HCC) 01/29/2016  . Community acquired pneumonia 01/29/2016  . Dyspnea   . Hypertension   . Morbid obesity with BMI of 45.0-49.9, adult (HCC) 02/02/2016    PAST SURGICAL HISTORY: Past Surgical History:  Procedure Laterality Date  . CARDIOVERSION N/A 02/01/2016   Procedure: CARDIOVERSION;  Surgeon: [name], MD;  Location: [facility] ENDOSCOPY;  Service: Cardiovascular;  Laterality: N/A;  . CARDIOVERSION N/A 02/26/2016   Procedure: CARDIOVERSION;  Surgeon: Jake Bathe, MD;  Location: [facility] ENDOSCOPY;  Service: Cardiovascular;  Laterality: N/A;  . TEE WITHOUT CARDIOVERSION N/A 02/01/2016   Procedure: TRANSESOPHAGEAL ECHOCARDIOGRAM (TEE);  Surgeon: [name], MD;  Location: [facility] ENDOSCOPY;  Service: Cardiovascular;  Laterality: N/A;    FAMILY HISTORY: The patient family history includes Hypertension in her mother.  SOCIAL HISTORY:  The patient  reports that she quit smoking about 28 years ago. Her smoking use included cigarettes. She quit after 20.00 years of use. She has never used smokeless tobacco. She reports current alcohol use. She reports that she does not use drugs.  REVIEW OF SYSTEMS: Review of Systems  Constitutional: Negative for chills and fever.  HENT: Negative for hoarse voice and nosebleeds.   Eyes: Negative for discharge, double vision and pain.  Cardiovascular: Negative for chest pain, claudication, dyspnea on exertion, leg swelling, near-syncope, orthopnea, palpitations, paroxysmal nocturnal dyspnea and syncope.  Respiratory: Negative for hemoptysis and  shortness of breath.   Musculoskeletal: Negative for muscle cramps and myalgias.  Gastrointestinal: Negative for abdominal pain, constipation, diarrhea, hematemesis, hematochezia, melena, nausea and vomiting.  Neurological: Negative for dizziness and light-headedness.    PHYSICAL EXAM: Vitals with BMI 09/23/2019 07/15/2019 06/29/2019  Height 5\' 7"  5\' 7"  5' 7.25"  Weight 284 lbs 288 lbs 5 oz 288 lbs  BMI 44.47 45.14 44.78  Systolic 138 182 174  Diastolic 89 86 80  Pulse 76 58 70    CONSTITUTIONAL: Well-developed and well-nourished. No acute distress.  SKIN: Skin is warm and dry. No rash noted. No cyanosis. No pallor. No jaundice HEAD: Normocephalic and atraumatic.  EYES: No scleral icterus MOUTH/THROAT: Moist oral membranes.  NECK: No JVD present. No thyromegaly noted. No carotid bruits  LYMPHATIC: No visible cervical adenopathy.  CHEST Normal respiratory effort. No intercostal retractions  LUNGS: Clear to auscultation bilaterally.  No stridor. No wheezes. No rales.  CARDIOVASCULAR: Regular rate and rhythm, positive S1-S2, no murmurs rubs or gallops appreciated ABDOMINAL: Obese, soft, nontender, distended, positive bowel sounds in all 4 quadrants.  No apparent ascites.  EXTREMITIES: No peripheral edema  HEMATOLOGIC: No significant bruising NEUROLOGIC:  Oriented to person, place, and time. Nonfocal. Normal muscle tone.  PSYCHIATRIC: Normal mood and affect. Normal behavior. Cooperative  CARDIAC DATABASE: EKG: 07/15/2019: Sinus  Rhythm, LAFB, left axis deviation, old anteroseptal infarct, poor R wave progression, without underlying ischemia or injury pattern.  Echocardiogram: 1/101/2018: LVEF severely reduced, 25-30%, diffuse hypokinesis, moderate MR, moderately dilated left atrium, mildly dilated RV with reduced systolic function, moderately dilated right atrium, moderate TR.  06/24/2016: LVEF 55%, normal wall motion, elevated LVEDP and LAP, mildly dilated left atrium.  Stress  Testing: None  Heart Catheterization: None  LABORATORY DATA: CBC Latest Ref Rng & Units 06/18/2019 02/19/2016 02/04/2016  WBC 4.0 - 10.5 K/uL 7.9 7.2 7.7  Hemoglobin 12.0 - 15.0 g/dL 15.2(H) 15.5 15.0  Hematocrit 36 - 46 % 46.1(H) 47.6(H) 46.3(H)  Platelets 150 - 400 K/uL 258 245 214    CMP Latest Ref Rng & Units 07/30/2019 06/18/2019 05/08/2016  Glucose 65 - 99 mg/dL 161(W) 960(A) 98  BUN 8 - 27 mg/dL 18 13 19   Creatinine 0.57 - 1.00 mg/dL 5.40 9.81  Sodium 134 - 144 mmol/L 139 139 141  Potassium 3.5 - 5.2 mmol/L 4.6 4.7 4.9  Chloride 96 - 106 mmol/L 100 103 98  CO2 20 - 29 mmol/L 22 23 25   Calcium 8.7 - 10.3 mg/dL 9.6 9.5 9.5  Total Protein 6.5 - 8.1 g/dL - - -  Total Bilirubin 0.3 - 1.2 mg/dL - - -  Alkaline Phos 38 - 126 U/L - - -  AST 15 - 41 U/L - - -  ALT 14 - 54 U/L - - -    Lipid Panel     Component Value Date/Time   CHOL 107 01/30/2016 0229   TRIG 61 01/30/2016 0229   HDL 41 01/30/2016 0229   CHOLHDL 2.6 01/30/2016 0229   VLDL 12 01/30/2016 0229   LDLCALC 54 01/30/2016 0229    No components found for: NTPROBNP Recent Labs    07/30/19 1357  PROBNP 327*   Recent Labs    07/30/19 1357  TSH 2.830    BMP Recent Labs    06/18/19 1730 07/30/19 1357  NA 139 139  K 4.7 4.6  CL 103 100  CO2 23 22  GLUCOSE 154* 100*  BUN 13 18  CREATININE 0.84 0.82  CALCIUM 9.5 9.6  GFRNONAA >60 74  GFRAA >60 86    HEMOGLOBIN A1C Lab Results  Component Value Date   HGBA1C 6.0 (H) 01/30/2016   MPG 126 01/30/2016    IMPRESSION:    ICD-10-CM   1. Paroxysmal atrial fibrillation (HCC)  I48.0 metoprolol succinate (TOPROL XL) 25 MG 24 hr tablet  2. Long term (current) use of anticoagulants  Z79.01 apixaban (ELIQUIS) 5 MG TABS tablet  3. Benign hypertension  I10 losartan (COZAAR) 25 MG tablet  4. Class 3 severe obesity due to excess calories with serious comorbidity and body mass index (BMI) of 45.0 to 49.9 in adult (HCC)  E66.01    Z68.42   5. Former smoker   Z87.891      RECOMMENDATIONS: TREAZURE NERY is a 69 y.o. female whose past medical history and cardiac risk factors include: Atrial fibrillation status post cardioversion x2, hypertension, history of congestive heart failure, former smoker, postmenopausal female, advanced age, obesity due to excess calories.  Paroxysmal atrial fibrillation: Currently normal sinus  Rate control: Metoprolol.   Rhythm control: N/A  Thromboembolic prophylaxis: Eliquis  Refilled Eliquis   Refilled Metoprolol   Patient had history of  cardioversion x2.  And no prior history of atrial fibrillation ablation.  CHA2DS2-VASc SCORE is 3 which correlates to 3.2% risk of stroke per year (hypertension, age, female gender).  Echocardiogram results reviewed with patient at today's office visit. She does not want to have a stress test (GXT or pharmacological).   Long-term oral anticoagulation:  Eliquis refilled  Patient does not endorse any evidence of bleeding.  Hg 15.2g/dL as of May 2021  Cr 1.61 mg/dL as of July 2021  Benign essential hypertension:  Refilled losartan 25 mg p.o. daily.  No blood pressure log to review at today's office visit.   Also recommended her to keep a blood pressure log to bring in at next office visit with either myself or her PCP to see if additional medication titration is warranted.  Obesity, due to excess calories: Body mass index is 44.48 kg/m. . I reviewed with the patient the importance of diet, regular physical activity/exercise, weight loss.   . Patient is educated on increasing physical activity gradually as tolerated.  With the goal of moderate intensity exercise for 30 minutes a day 5 days a week.  FINAL MEDICATION LIST END OF ENCOUNTER: Meds ordered this encounter  Medications  . metoprolol succinate (TOPROL XL) 25 MG 24 hr tablet    Sig: Take 1 tablet (25 mg total) by mouth daily.    Dispense:  90 tablet    Refill:  1  . losartan (COZAAR) 25 MG tablet     Sig: Take 1 tablet (25 mg total) by mouth in the morning.    Dispense:  90 tablet    Refill:  0  . apixaban (ELIQUIS) 5 MG TABS tablet    Sig: Take 1 tablet (5 mg total) by mouth 2 (two) times daily.    Dispense:  60 tablet    Refill:  0    Medications Discontinued During This Encounter  Medication Reason  . metoprolol succinate (TOPROL-XL) 25 MG 24 hr tablet Dose change  . losartan (COZAAR) 25 MG tablet Reorder  . apixaban (ELIQUIS) 5 MG TABS tablet Reorder     Current Outpatient Medications:  .  apixaban (ELIQUIS) 5 MG TABS tablet, Take 1 tablet (5 mg total) by mouth 2 (two) times daily., Disp: 60 tablet, Rfl: 0 .  Ascorbic Acid (VITAMIN C) 1000 MG tablet, Take 2,000 mg by mouth daily., Disp: , Rfl:  .  B Complex Vitamins (B COMPLEX PO), Take 1 tablet by mouth 2 (two) times daily., Disp: , Rfl:  .  COENZYME Q-10 PO, Take 1 capsule by mouth 4 (four) times daily., Disp: , Rfl:  .  HAWTHORNE BERRY PO, Take 1 capsule by mouth 2 (two) times daily., Disp: , Rfl:  .  losartan (COZAAR) 25 MG tablet, Take 1 tablet (25 mg total) by mouth in the morning., Disp: 90 tablet, Rfl: 0 .  MAGNESIUM PO, Take 1,000 mg by mouth daily., Disp: , Rfl:  .  Misc Natural Products (TURMERIC CURCUMIN) CAPS, Take 2 capsules by mouth 2 (two) times daily., Disp: , Rfl:  .  NON FORMULARY, Take 2 capsules by mouth daily. Taurine 1,000 mg, Disp: , Rfl:  .  OVER THE COUNTER MEDICATION, Beet Root: Take 1 capsule by mouth four times a day, Disp: , Rfl:  .  OVER THE COUNTER MEDICATION, Ashwaganda capsules: Take 2 capsules by mouth two times a day, Disp: , Rfl:  .  POTASSIUM CHLORIDE PO, Take 1 tablet by mouth daily. Not sure the dose, Disp: ,  Rfl:  .  metoprolol succinate (TOPROL XL) 25 MG 24 hr tablet, Take 1 tablet (25 mg total) by mouth daily., Disp: 90 tablet, Rfl: 1  No orders of the defined types were placed in this encounter.   There are no Patient Instructions on file for this visit.   --Continue cardiac  medications as reconciled in final medication list. --Return in about 6 months (around 03/22/2020) for A. fib management . Or sooner if needed. --Continue follow-up with your primary care physician regarding the management of your other chronic comorbid conditions.  Patient's questions and concerns were addressed to her satisfaction. She voices understanding of the instructions provided during this encounter.   This note was created using a voice recognition software as a result there may be grammatical errors inadvertently enclosed that do not reflect the nature of this encounter. Every attempt is made to correct such errors.  Tessa Lerner, [place], [facility]  Pager: [phone] Office: [phone]

## 2019-10-28 ENCOUNTER — Other Ambulatory Visit: Payer: Self-pay | Admitting: Cardiology

## 2019-10-28 DIAGNOSIS — Z7901 Long term (current) use of anticoagulants: Secondary | ICD-10-CM

## 2019-12-23 ENCOUNTER — Other Ambulatory Visit: Payer: Self-pay | Admitting: Cardiology

## 2019-12-23 DIAGNOSIS — I10 Essential (primary) hypertension: Secondary | ICD-10-CM

## 2019-12-28 ENCOUNTER — Other Ambulatory Visit: Payer: Self-pay | Admitting: Cardiology

## 2019-12-28 DIAGNOSIS — I10 Essential (primary) hypertension: Secondary | ICD-10-CM

## 2020-03-26 ENCOUNTER — Other Ambulatory Visit: Payer: Self-pay | Admitting: Cardiology

## 2020-03-26 DIAGNOSIS — I480 Paroxysmal atrial fibrillation: Secondary | ICD-10-CM

## 2020-03-27 ENCOUNTER — Other Ambulatory Visit: Payer: Self-pay | Admitting: Cardiology

## 2020-03-27 DIAGNOSIS — I480 Paroxysmal atrial fibrillation: Secondary | ICD-10-CM

## 2020-03-28 ENCOUNTER — Other Ambulatory Visit: Payer: Self-pay | Admitting: Cardiology

## 2020-03-28 ENCOUNTER — Other Ambulatory Visit: Payer: Self-pay

## 2020-03-28 ENCOUNTER — Encounter: Payer: Self-pay | Admitting: Cardiology

## 2020-03-28 ENCOUNTER — Ambulatory Visit: Payer: PPO | Admitting: Cardiology

## 2020-03-28 VITALS — BP 150/94 | HR 77 | Temp 98.0°F | Resp 16 | Ht 67.0 in | Wt 282.0 lb

## 2020-03-28 DIAGNOSIS — I10 Essential (primary) hypertension: Secondary | ICD-10-CM

## 2020-03-28 DIAGNOSIS — Z87891 Personal history of nicotine dependence: Secondary | ICD-10-CM

## 2020-03-28 DIAGNOSIS — I480 Paroxysmal atrial fibrillation: Secondary | ICD-10-CM

## 2020-03-28 DIAGNOSIS — Z7901 Long term (current) use of anticoagulants: Secondary | ICD-10-CM

## 2020-03-28 MED ORDER — FUROSEMIDE 20 MG PO TABS: 20.0000 mg | ORAL_TABLET | Freq: Every day | ORAL | 0 refills | Status: DC | PRN

## 2020-03-28 MED ORDER — METOPROLOL SUCCINATE ER 50 MG PO TB24: 50.0000 mg | ORAL_TABLET | Freq: Every day | ORAL | 0 refills | Status: DC

## 2020-03-28 NOTE — Progress Notes (Signed)
ID:  Briana Kane, DOB 10-20-1950, MRN [id number]  PCP:  Dois Davenport, MD  Cardiologist:  Tessa Lerner, DO, [facility] (established care 07/15/2019) Former Cardiology Providers: Dr. Clifton James (consult 01/2016) Electrophysiologist: Dr. Elberta Fortis  Date: 03/28/20 Last Office Visit: 09/23/2019  Chief Complaint  Patient presents with  . Atrial Fibrillation  . Follow-up    6 month    HPI  Briana Kane is a 70 y.o. female who presents to the office with a chief complaint of " 53-month follow-up for atrial fibrillation management." Patient's past medical history and cardiovascular risk factors include: Atrial fibrillation status post cardioversion x2, hypertension, history of congestive heart failure, former smoker, postmenopausal female, advanced age, obesity due to excess calories.  Patient was under the care of multiple cardiovascular physicians in the past and was referred to the office by the ER at [facility] for management of atrial fibrillation.  Per patient, she was diagnosed with atrial fibrillation in 2018 and at that time she was also being treated for heart failure with reduced EF most likely secondary to tachycardia/A. fib with RVR.  She has undergone cardioversion x2 in the past.  No prior history of atrial fibrillation ablation.  In the past, she was also on amiodarone but due to the side effect profile discontinued it.  She is currently on rate control strategy with metoprolol and on Eliquis for thromboembolic prophylaxis.  She presents today for 39-month follow-up.  Patient states that "she is doing great" from a cardiovascular standpoint.  No hospitalizations or urgent care visits for either atrial fibrillation or congestive heart failure.  In the past patient has refused an ischemic evaluation due to multiple reasons.  No family history of premature coronary artery disease or sudden cardiac death.  FUNCTIONAL STATUS: No structured exercise program or daily  routine.    ALLERGIES: Allergies  Allergen Reactions  . Gluten Meal Shortness Of Breath and Other (See Comments)    Triggered fluid around her heart a couple of years ago (NO BREAD OR PASTA)  . Wheat Bran Shortness Of Breath    Also, possible edema (around heart)  . 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 Mold Extract [Trichophyton] Other (See Comments)    Causes congestion (sudden)  . Neosporin [Neomycin-Bacitracin Zn-Polymyx] Rash  . Other Rash    Neoprene (NO!)  . Petroleum Jelly Lip Treatment [Dimethicone] Rash  . Tape Rash    MEDICATION LIST PRIOR TO VISIT: Current Meds  Medication Sig  . Ascorbic Acid (VITAMIN C) 1000 MG tablet Take 2,000 mg by mouth daily.  . B Complex Vitamins (B COMPLEX PO) Take 1 tablet by mouth 2 (two) times daily.  Marland Kitchen COENZYME Q-10 PO Take 1 capsule by mouth 4 (four) times daily.  Marland Kitchen ELIQUIS 5 MG TABS tablet TAKE 1 TABLET(5 MG) BY MOUTH TWICE DAILY  . furosemide (LASIX) 20 MG tablet Take 1 tablet (20 mg total) by mouth daily as needed for up to 15 days for edema.  Marland Kitchen HAWTHORNE BERRY PO Take 1 capsule by mouth 2 (two) times daily.  Marland Kitchen losartan (COZAAR) 25 MG tablet TAKE 1 TABLET(25 MG) BY MOUTH IN THE MORNING  . MAGNESIUM PO Take 1,000 mg by mouth daily.  . metoprolol succinate (TOPROL-XL) 50 MG 24 hr tablet Take 1 tablet (50 mg total) by mouth daily. Hold if top blood pressure number less than 100 mmHg or heart rate less than 60 bpm.  . Misc Natural Products (  TURMERIC CURCUMIN) CAPS Take 2 capsules by mouth 2 (two) times daily.  . NON FORMULARY Take 2 capsules by mouth daily. Taurine 1,000 mg  . OVER THE COUNTER MEDICATION Beet Root: Take 1 capsule by mouth four times a day  . OVER THE COUNTER MEDICATION Ashwaganda capsules: Take 2 capsules by mouth two times a day  . POTASSIUM CHLORIDE PO Take 1 tablet by mouth daily. Not sure the dose  . [DISCONTINUED] metoprolol succinate (TOPROL-XL) 25 MG 24 hr tablet TAKE 1 TABLET(25 MG)  BY MOUTH DAILY     PAST MEDICAL HISTORY: Past Medical History:  Diagnosis Date  . Acute systolic CHF (congestive heart failure) (HCC) 02/02/2016  . Atrial fibrillation with RVR (HCC) 01/29/2016  . Community acquired pneumonia 01/29/2016  . Dyspnea   . Hypertension   . Morbid obesity with BMI of 45.0-49.9, adult (HCC) 02/02/2016    PAST SURGICAL HISTORY: Past Surgical History:  Procedure Laterality Date  . CARDIOVERSION N/A 02/01/2016   Procedure: CARDIOVERSION;  Surgeon: Vesta MixerPhilip J Nahser, MD;  [facility];  Service: Cardiovascular;  Laterality: N/A;  . CARDIOVERSION N/A 02/26/2016   Procedure: CARDIOVERSION;  Surgeon: Jake BatheMark C Skains, MD;  [facility];  Service: Cardiovascular;  Laterality: N/A;  . TEE WITHOUT CARDIOVERSION N/A 02/01/2016   Procedure: TRANSESOPHAGEAL ECHOCARDIOGRAM (TEE);  Surgeon: Vesta MixerPhilip J Nahser, MD;  [facility];  Service: Cardiovascular;  Laterality: N/A;    FAMILY HISTORY: The patient family history includes Hypertension in her mother.  SOCIAL HISTORY:  The patient  reports that she quit smoking about 30 years ago. Her smoking use included cigarettes. She has a 20.00 pack-year smoking history. She has never used smokeless tobacco. She reports current alcohol use. She reports that she does not use drugs.  REVIEW OF SYSTEMS: Review of Systems  Constitutional: Negative for chills and fever.  HENT: Negative for hoarse voice and nosebleeds.   Eyes: Negative for discharge, double vision and pain.  Cardiovascular: Negative for chest pain, claudication, dyspnea on exertion, leg swelling, near-syncope, orthopnea, palpitations, paroxysmal nocturnal dyspnea and syncope.  Respiratory: Negative for hemoptysis and shortness of breath.   Musculoskeletal: Negative for muscle cramps and myalgias.  Gastrointestinal: Negative for abdominal pain, constipation, diarrhea, hematemesis, hematochezia, melena, nausea and vomiting.  Neurological: Negative for  dizziness and light-headedness.    PHYSICAL EXAM: Vitals with BMI 03/28/2020 03/28/2020 09/23/2019  Height - 5\' 7"  5\' 7"   Weight - 282 lbs 284 lbs  BMI - 44.16 44.47  Systolic 150 160 [id number]  Diastolic 94 94 89  Pulse 77 139 76    CONSTITUTIONAL: Well-developed and well-nourished. No acute distress.  SKIN: Skin is warm and dry. No rash noted. No cyanosis. No pallor. No jaundice HEAD: Normocephalic and atraumatic.  EYES: No scleral icterus MOUTH/THROAT: Moist oral membranes.  NECK: No JVD present. No thyromegaly noted. No carotid bruits  LYMPHATIC: No visible cervical adenopathy.  CHEST Normal respiratory effort. No intercostal retractions  LUNGS: Clear to auscultation bilaterally.  No stridor. No wheezes. No rales.  CARDIOVASCULAR: Regular rate and rhythm, positive S1-S2, no murmurs rubs or gallops appreciated ABDOMINAL: Obese, soft, nontender, distended, positive bowel sounds in all 4 quadrants.  No apparent ascites.  EXTREMITIES: +1 bilateral peripheral edema  HEMATOLOGIC: No significant bruising NEUROLOGIC: Oriented to person, place, and time. Nonfocal. Normal muscle tone.  PSYCHIATRIC: Normal mood and affect. Normal behavior. Cooperative  CARDIAC DATABASE: EKG: 07/15/2019: Sinus  Rhythm, LAFB, left axis deviation, old anteroseptal infarct, poor R wave progression, without underlying ischemia or  injury pattern.  Echocardiogram: 1/101/2018: LVEF severely reduced, 25-30%, diffuse hypokinesis, moderate MR, moderately dilated left atrium, mildly dilated RV with reduced systolic function, moderately dilated right atrium, moderate TR.  06/24/2016: LVEF 55%, normal wall motion, elevated LVEDP and LAP, mildly dilated left atrium.  07/23/2019:  Normal LV systolic function with EF 55%. Left ventricle cavity is normal in size. Normal left ventricular wall thickness. Normal global wall motion. Indeterminate diastolic filling pattern due to E:A fusion. Patient was tachycardic throughout the study.   Structurally normal mitral valve. Mild (Grade I) mitral regurgitation.  Structurally normal tricuspid valve. Mild tricuspid regurgitation. No  evidence of pulmonary hypertension. RVSP measures 30 mmHg.  Stress Testing: None  Heart Catheterization: None  LABORATORY DATA: CBC Latest Ref Rng & Units 06/18/2019 02/19/2016 02/04/2016  WBC 4.0 - 10.5 K/uL 7.9 7.2 7.7  Hemoglobin 12.0 - 15.0 g/dL 15.2(H) 15.5 15.0  Hematocrit 36.0 - 46.0 % 46.1(H) 47.6(H) 46.3(H)  Platelets 150 - 400 K/uL 258 245 214    CMP Latest Ref Rng & Units 07/30/2019 06/18/2019 05/08/2016  Glucose 65 - 99 mg/dL 235(T) 732(K) 98  BUN 8 - 27 mg/dL 18 13 19   Creatinine 0.57 - 1.00 mg/dL 0.25 4.27 0.62  Sodium 134 - 144 mmol/L 139 139 141  Potassium 3.5 - 5.2 mmol/L 4.6 4.7 4.9  Chloride 96 - 106 mmol/L 100 103 98  CO2 20 - 29 mmol/L 22 23 25   Calcium 8.7 - 10.3 mg/dL 9.6 9.5 9.5  Total Protein 6.5 - 8.1 g/dL - - -  Total Bilirubin 0.3 - 1.2 mg/dL - - -  Alkaline Phos 38 - 126 U/L - - -  AST 15 - 41 U/L - - -  ALT 14 - 54 U/L - - -    Lipid Panel     Component Value Date/Time   CHOL 107 01/30/2016 0229   TRIG 61 01/30/2016 0229   HDL 41 01/30/2016 0229   CHOLHDL 2.6 01/30/2016 0229   VLDL 12 01/30/2016 0229   LDLCALC 54 01/30/2016 0229    No components found for: NTPROBNP Recent Labs    07/30/19 1357  PROBNP 327*   Recent Labs    07/30/19 1357  TSH 2.830    BMP Recent Labs    06/18/19 1730 07/30/19 1357  NA 139 139  K 4.7 4.6  CL 103 100  CO2 23 22  GLUCOSE 154* 100*  BUN 13 18  CREATININE 0.84 0.82  CALCIUM 9.5 9.6  GFRNONAA >60 74  GFRAA >60 86    HEMOGLOBIN A1C Lab Results  Component Value Date   HGBA1C 6.0 (H) 01/30/2016   MPG 126 01/30/2016    IMPRESSION:    ICD-10-CM   1. Paroxysmal atrial fibrillation (HCC)  I48.0 EKG 12-Lead    metoprolol succinate (TOPROL-XL) 50 MG 24 hr tablet  2. Long term (current) use of anticoagulants  Z79.01 Basic metabolic panel     Hemoglobin and hematocrit, blood  3. Benign hypertension  I10 furosemide (LASIX) 20 MG tablet    Basic metabolic panel    Magnesium  4. Former smoker  Z87.891      RECOMMENDATIONS: Briana Kane is a 70 y.o. female whose past medical history and cardiac risk factors include: Atrial fibrillation status post cardioversion x2, hypertension, history of congestive heart failure, former smoker, postmenopausal female, advanced age, obesity due to excess calories.  Paroxysmal atrial fibrillation:   Rate control: Metoprolol.   Rhythm control: N/A  Thromboembolic prophylaxis: Eliquis  EKG today  shows Afib which is new compared to prior.   Increase Metoprolol to 50 mg po qday.   Patient had history of cardioversion x2.  And no prior history of atrial fibrillation ablation.  CHA2DS2-VASc SCORE is 3 which correlates to 3.2% risk of stroke per year (hypertension, age, female gender).  She does not want to have a stress test (GXT or pharmacological).   Long-term oral anticoagulation:  Indication: Paroxysmal atrial fibrillation.  Patient does not endorse any evidence of bleeding.  Check BMP, magnesium, and H&H.  Most recent labs from 07/30/2019 independently reviewed at today's office visit.  Lower extremity swelling:  Patient noted to have +1 bilateral pitting edema I suspect is most likely secondary to her being in atrial fibrillation.  Recommended low-salt diet.  We will start low-dose Lasix for the next 2 weeks.  Blood work in 1 week to evaluate kidney function and electrolytes.  Monitor for now.   Benign essential hypertension:  Office blood pressure not well controlled.   Patient does not check her BP at home. I have encouraged her to do this given her office reading.   She is asked to keep a log of her BP and review it w/ her PCP to see if additional medical therapy is warrented.   Low salt diet.   Currently managed by primary care provider.  Obesity, due to  excess calories: Body mass index is 44.17 kg/m. . I reviewed with the patient the importance of diet, regular physical activity/exercise, weight loss.   . Patient is educated on increasing physical activity gradually as tolerated.  With the goal of moderate intensity exercise for 30 minutes a day 5 days a week.  FINAL MEDICATION LIST END OF ENCOUNTER: Meds ordered this encounter  Medications  . metoprolol succinate (TOPROL-XL) 50 MG 24 hr tablet    Sig: Take 1 tablet (50 mg total) by mouth daily. Hold if top blood pressure number less than 100 mmHg or heart rate less than 60 bpm.    Dispense:  90 tablet    Refill:  0  . furosemide (LASIX) 20 MG tablet    Sig: Take 1 tablet (20 mg total) by mouth daily as needed for up to 15 days for edema.    Dispense:  15 tablet    Refill:  0    Medications Discontinued During This Encounter  Medication Reason  . metoprolol succinate (TOPROL-XL) 25 MG 24 hr tablet Dose change     Current Outpatient Medications:  .  Ascorbic Acid (VITAMIN C) 1000 MG tablet, Take 2,000 mg by mouth daily., Disp: , Rfl:  .  B Complex Vitamins (B COMPLEX PO), Take 1 tablet by mouth 2 (two) times daily., Disp: , Rfl:  .  COENZYME Q-10 PO, Take 1 capsule by mouth 4 (four) times daily., Disp: , Rfl:  .  ELIQUIS 5 MG TABS tablet, TAKE 1 TABLET(5 MG) BY MOUTH TWICE DAILY, Disp: 60 tablet, Rfl: 6 .  furosemide (LASIX) 20 MG tablet, Take 1 tablet (20 mg total) by mouth daily as needed for up to 15 days for edema., Disp: 15 tablet, Rfl: 0 .  HAWTHORNE BERRY PO, Take 1 capsule by mouth 2 (two) times daily., Disp: , Rfl:  .  losartan (COZAAR) 25 MG tablet, TAKE 1 TABLET(25 MG) BY MOUTH IN THE MORNING, Disp: 90 tablet, Rfl: 1 .  MAGNESIUM PO, Take 1,000 mg by mouth daily., Disp: , Rfl:  .  metoprolol succinate (TOPROL-XL) 50 MG 24 hr tablet, Take 1  tablet (50 mg total) by mouth daily. Hold if top blood pressure number less than 100 mmHg or heart rate less than 60 bpm., Disp: 90  tablet, Rfl: 0 .  Misc Natural Products (TURMERIC CURCUMIN) CAPS, Take 2 capsules by mouth 2 (two) times daily., Disp: , Rfl:  .  NON FORMULARY, Take 2 capsules by mouth daily. Taurine 1,000 mg, Disp: , Rfl:  .  OVER THE COUNTER MEDICATION, Beet Root: Take 1 capsule by mouth four times a day, Disp: , Rfl:  .  OVER THE COUNTER MEDICATION, Ashwaganda capsules: Take 2 capsules by mouth two times a day, Disp: , Rfl:  .  POTASSIUM CHLORIDE PO, Take 1 tablet by mouth daily. Not sure the dose, Disp: , Rfl:   Orders Placed This Encounter  Procedures  . Basic metabolic panel  . Magnesium  . Hemoglobin and hematocrit, blood  . EKG 12-Lead    There are no Patient Instructions on file for this visit.   --Continue cardiac medications as reconciled in final medication list. --Return in about 4 weeks (around 04/25/2020) for Follow up, A. fib. Or sooner if needed. --Continue follow-up with your primary care physician regarding the management of your other chronic comorbid conditions.  Patient's questions and concerns were addressed to her satisfaction. She voices understanding of the instructions provided during this encounter.   This note was created using a voice recognition software as a result there may be grammatical errors inadvertently enclosed that do not reflect the nature of this encounter. Every attempt is made to correct such errors.  Tessa Lerner, [place], [facility]  Pager: [phone] Office: [phone]

## 2020-03-30 ENCOUNTER — Other Ambulatory Visit: Payer: Self-pay

## 2020-03-30 DIAGNOSIS — I10 Essential (primary) hypertension: Secondary | ICD-10-CM

## 2020-03-30 DIAGNOSIS — Z7901 Long term (current) use of anticoagulants: Secondary | ICD-10-CM

## 2020-04-21 DIAGNOSIS — I10 Essential (primary) hypertension: Secondary | ICD-10-CM | POA: Diagnosis not present

## 2020-04-21 DIAGNOSIS — Z7901 Long term (current) use of anticoagulants: Secondary | ICD-10-CM | POA: Diagnosis not present

## 2020-04-22 LAB — BASIC METABOLIC PANEL
BUN/Creatinine Ratio: 25 (ref 12–28)
BUN: 20 mg/dL (ref 8–27)
CO2: 22 mmol/L (ref 20–29)
Calcium: 9.5 mg/dL (ref 8.7–10.3)
Chloride: 102 mmol/L (ref 96–106)
Creatinine, Ser: 0.8 mg/dL (ref 0.57–1.00)
Glucose: 93 mg/dL (ref 65–99)
Potassium: 4.8 mmol/L (ref 3.5–5.2)
Sodium: 141 mmol/L (ref 134–144)
eGFR: 80 mL/min/{1.73_m2} (ref >59)

## 2020-04-22 LAB — MAGNESIUM: Magnesium: 2.3 mg/dL (ref 1.6–2.3)

## 2020-04-22 LAB — HEMOGLOBIN AND HEMATOCRIT, BLOOD
Hematocrit: 45.2 % (ref 34.0–46.6)
Hemoglobin: 14.8 g/dL (ref 11.1–15.9)

## 2020-05-09 ENCOUNTER — Encounter: Payer: Self-pay | Admitting: Cardiology

## 2020-05-09 ENCOUNTER — Ambulatory Visit: Payer: PPO | Admitting: Cardiology

## 2020-05-09 ENCOUNTER — Other Ambulatory Visit: Payer: Self-pay

## 2020-05-09 VITALS — BP 156/80 | HR 98 | Temp 95.5°F | Resp 16 | Ht 67.0 in | Wt 286.8 lb

## 2020-05-09 DIAGNOSIS — Z6841 Body Mass Index (BMI) 40.0 and over, adult: Secondary | ICD-10-CM

## 2020-05-09 DIAGNOSIS — Z87891 Personal history of nicotine dependence: Secondary | ICD-10-CM

## 2020-05-09 DIAGNOSIS — I480 Paroxysmal atrial fibrillation: Secondary | ICD-10-CM

## 2020-05-09 DIAGNOSIS — I4819 Other persistent atrial fibrillation: Secondary | ICD-10-CM

## 2020-05-09 DIAGNOSIS — I10 Essential (primary) hypertension: Secondary | ICD-10-CM | POA: Diagnosis not present

## 2020-05-09 DIAGNOSIS — Z7901 Long term (current) use of anticoagulants: Secondary | ICD-10-CM

## 2020-05-09 NOTE — Progress Notes (Signed)
ID:  Briana Kane, DOB [birth year], MRN [id number]  PCP:  Dois Davenport, MD  Cardiologist:  Tessa Lerner, DO, [facility] (established care 07/15/2019) Former Cardiology Providers: Dr. Clifton James (consult 01/2016) Electrophysiologist: Dr. Elberta Fortis  Date: 05/09/20 Last Office Visit: 03/28/2020  Chief Complaint  Patient presents with  . Paroxysmal atrial fibrillation   . Follow-up    HPI  Briana Kane is a 70 y.o. female who presents to the office with a chief complaint of " 1 month follow-up for A. fib management and lower extremity swelling." Patient's past medical history and cardiovascular risk factors include: Atrial fibrillation status post cardioversion x2, hypertension, history of congestive heart failure, former smoker, postmenopausal female, advanced age, obesity due to excess calories.  Patient was under the care of multiple cardiovascular physicians in the past and was referred to the office by the ER at [facility] for management of atrial fibrillation.  Per patient, she was diagnosed with atrial fibrillation in 2018 and at that time she was also being treated for heart failure with reduced EF most likely secondary to tachycardia/A. fib with RVR.  She has undergone cardioversion x2 in the past.  No prior history of atrial fibrillation ablation.  In the past, she was also on amiodarone but due to the side effect profile discontinued it.  She is currently on rate control strategy with metoprolol and on Eliquis for thromboembolic prophylaxis.  At the last office visit her dose of metoprolol was increased and she is tolerated the medication change well without any side effects or intolerance.  She also had lower extremity swelling at the last office visit and therefore she was given 2 weeks of Lasix which helped improve her symptoms.  Clinically patient states that "she is doing great" from a cardiovascular standpoint.  No hospitalizations or urgent care visits for either  atrial fibrillation or congestive heart failure.  In the past patient has refused an ischemic evaluation due to multiple reasons.  No family history of premature coronary artery disease or sudden cardiac death.  FUNCTIONAL STATUS: No structured exercise program or daily routine.    ALLERGIES: Allergies  Allergen Reactions  . Gluten Meal Shortness Of Breath and Other (See Comments)    Triggered fluid around her heart a couple of years ago (NO BREAD OR PASTA)  . Wheat Bran Shortness Of Breath    Also, possible edema (around heart)  . 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 Mold Extract [Trichophyton] Other (See Comments)    Causes congestion (sudden)  . Neosporin [Neomycin-Bacitracin Zn-Polymyx] Rash  . Other Rash    Neoprene (NO!)  . Petroleum Jelly Lip Treatment [Dimethicone] Rash  . Tape Rash    MEDICATION LIST PRIOR TO VISIT: Current Meds  Medication Sig  . Ascorbic Acid (VITAMIN C) 1000 MG tablet Take 2,000 mg by mouth daily.  . B Complex Vitamins (B COMPLEX PO) Take 1 tablet by mouth 2 (two) times daily.  Marland Kitchen COENZYME Q-10 PO Take 1 capsule by mouth 4 (four) times daily.  Marland Kitchen ELIQUIS 5 MG TABS tablet TAKE 1 TABLET(5 MG) BY MOUTH TWICE DAILY  . losartan (COZAAR) 25 MG tablet TAKE 1 TABLET(25 MG) BY MOUTH IN THE MORNING  . MAGNESIUM PO Take 1,000 mg by mouth daily.  . metoprolol succinate (TOPROL-XL) 50 MG 24 hr tablet Take 1 tablet (50 mg total) by mouth daily. Hold if top blood pressure number less than 100 mmHg or heart rate  less than 60 bpm.  . Misc Natural Products (TURMERIC CURCUMIN) CAPS Take 2 capsules by mouth 2 (two) times daily.  . NON FORMULARY Take 2 capsules by mouth daily. Taurine 1,000 mg  . OVER THE COUNTER MEDICATION Beet Root: Take 1 capsule by mouth four times a day  . OVER THE COUNTER MEDICATION Ashwaganda capsules: Take 2 capsules by mouth two times a day  . POTASSIUM CHLORIDE PO Take 1 tablet by mouth daily. Not sure the dose   . [DISCONTINUED] furosemide (LASIX) 20 MG tablet Take 1 tablet (20 mg total) by mouth daily as needed for up to 15 days for edema.     PAST MEDICAL HISTORY: Past Medical History:  Diagnosis Date  . Acute systolic CHF (congestive heart failure) (HCC) 02/02/2016  . Atrial fibrillation with RVR (HCC) 01/29/2016  . Community acquired pneumonia 01/29/2016  . Dyspnea   . Hypertension   . Morbid obesity with BMI of 45.0-49.9, adult (HCC) 02/02/2016    PAST SURGICAL HISTORY: Past Surgical History:  Procedure Laterality Date  . CARDIOVERSION N/A 02/01/2016   Procedure: CARDIOVERSION;  Surgeon: [name], MD;  Location: [facility] ENDOSCOPY;  Service: Cardiovascular;  Laterality: N/A;  . CARDIOVERSION N/A 02/26/2016   Procedure: CARDIOVERSION;  Surgeon: Jake Bathe, MD;  Location: [facility] ENDOSCOPY;  Service: Cardiovascular;  Laterality: N/A;  . TEE WITHOUT CARDIOVERSION N/A 02/01/2016   Procedure: TRANSESOPHAGEAL ECHOCARDIOGRAM (TEE);  Surgeon: [name], MD;  Location: [facility] ENDOSCOPY;  Service: Cardiovascular;  Laterality: N/A;    FAMILY HISTORY: The patient family history includes Hypertension in her mother.  SOCIAL HISTORY:  The patient  reports that she quit smoking about 30 years ago. Her smoking use included cigarettes. She has a 20.00 pack-year smoking history. She has never used smokeless tobacco. She reports current alcohol use. She reports that she does not use drugs.  REVIEW OF SYSTEMS: Review of Systems  Constitutional: Negative for chills and fever.  HENT: Negative for hoarse voice and nosebleeds.   Eyes: Negative for discharge, double vision and pain.  Cardiovascular: Negative for chest pain, claudication, dyspnea on exertion, leg swelling, near-syncope, orthopnea, palpitations, paroxysmal nocturnal dyspnea and syncope.  Respiratory: Negative for hemoptysis and shortness of breath.   Musculoskeletal: Negative for muscle cramps and myalgias.  Gastrointestinal: Negative for abdominal  pain, constipation, diarrhea, hematemesis, hematochezia, melena, nausea and vomiting.  Neurological: Negative for dizziness and light-headedness.    PHYSICAL EXAM: Vitals with BMI 05/09/2020 05/09/2020 03/28/2020  Height - 5\' 7"  -  Weight - 286 lbs 13 oz -  BMI - 44.91 -  Systolic 156 166 616  Diastolic 80 120 94  Pulse - 98 77    CONSTITUTIONAL: Well-developed and well-nourished. No acute distress.  SKIN: Skin is warm and dry. No rash noted. No cyanosis. No pallor. No jaundice HEAD: Normocephalic and atraumatic.  EYES: No scleral icterus MOUTH/THROAT: Moist oral membranes.  NECK: No JVD present. No thyromegaly noted. No carotid bruits  LYMPHATIC: No visible cervical adenopathy.  CHEST Normal respiratory effort. No intercostal retractions  LUNGS: Clear to auscultation bilaterally.  No stridor. No wheezes. No rales.  CARDIOVASCULAR: Regular rate and rhythm, positive S1-S2, no murmurs rubs or gallops appreciated ABDOMINAL: Obese, soft, nontender, distended, positive bowel sounds in all 4 quadrants.  No apparent ascites.  EXTREMITIES: +1 bilateral peripheral edema  HEMATOLOGIC: No significant bruising NEUROLOGIC: Oriented to person, place, and time. Nonfocal. Normal muscle tone.  PSYCHIATRIC: Normal mood and affect. Normal behavior. Cooperative  CARDIAC DATABASE: EKG: 07/15/2019: Sinus  Rhythm, LAFB, left axis deviation, old anteroseptal infarct, poor R wave progression, without underlying ischemia or injury pattern.  Echocardiogram: 1/101/2018: LVEF severely reduced, 25-30%, diffuse hypokinesis, moderate MR, moderately dilated left atrium, mildly dilated RV with reduced systolic function, moderately dilated right atrium, moderate TR.  06/24/2016: LVEF 55%, normal wall motion, elevated LVEDP and LAP, mildly dilated left atrium.  07/23/2019:  Normal LV systolic function with EF 55%. Left ventricle cavity is normal in size. Normal left ventricular wall thickness. Normal global wall  motion. Indeterminate diastolic filling pattern due to E:A fusion. Patient was tachycardic throughout the study.  Structurally normal mitral valve. Mild (Grade I) mitral regurgitation.  Structurally normal tricuspid valve. Mild tricuspid regurgitation. No  evidence of pulmonary hypertension. RVSP measures 30 mmHg.  Stress Testing: None  Heart Catheterization: None  LABORATORY DATA: CBC Latest Ref Rng & Units 04/21/2020 06/18/2019 02/19/2016  WBC 4.0 - 10.5 K/uL - 7.9 7.2  Hemoglobin 11.1 - 15.9 g/dL 16.114.8 15.2(H) 15.5  Hematocrit 34.0 - 46.6 % 45.2 46.1(H) 47.6(H)  Platelets 150 - 400 K/uL - 258 245    CMP Latest Ref Rng & Units 04/21/2020 07/30/2019 06/18/2019  Glucose 65 - 99 mg/dL 93 096(E100(H) 454(U154(H)  BUN 8 - 27 mg/dL 20 18 13   Creatinine 0.57 - 1.00 mg/dL 9.810.80 1.910.82 4.780.84  Sodium 134 - 144 mmol/L 141 139 139  Potassium 3.5 - 5.2 mmol/L 4.8 4.6 4.7  Chloride 96 - 106 mmol/L 102 100 103  CO2 20 - 29 mmol/L 22 22 23   Calcium 8.7 - 10.3 mg/dL 9.5 9.6 9.5  Total Protein 6.5 - 8.1 g/dL - - -  Total Bilirubin 0.3 - 1.2 mg/dL - - -  Alkaline Phos 38 - 126 U/L - - -  AST 15 - 41 U/L - - -  ALT 14 - 54 U/L - - -    Lipid Panel     Component Value Date/Time   CHOL 107 01/30/2016 0229   TRIG 61 01/30/2016 0229   HDL 41 01/30/2016 0229   CHOLHDL 2.6 01/30/2016 0229   VLDL 12 01/30/2016 0229   LDLCALC 54 01/30/2016 0229    No components found for: NTPROBNP Recent Labs    07/30/19 1357  PROBNP 327*   Recent Labs    07/30/19 1357  TSH 2.830    BMP Recent Labs    06/18/19 1730 07/30/19 1357 04/21/20 1315  NA 139 139 141  K 4.7 4.6 4.8  CL 103 100 102  CO2 23 22 22   GLUCOSE 154* 100* 93  BUN 13 18 20   CREATININE 0.84 0.82 0.80  CALCIUM 9.5 9.6 9.5  GFRNONAA >60 74  --   GFRAA >60 86  --     HEMOGLOBIN A1C Lab Results  Component Value Date   HGBA1C 6.0 (H) 01/30/2016   MPG 126 01/30/2016    IMPRESSION:    ICD-10-CM   1. Paroxysmal atrial fibrillation (HCC)   I48.0   2. Long term (current) use of anticoagulants  Z79.01   3. Benign hypertension  I10   4. Class 3 severe obesity due to excess calories with serious comorbidity and body mass index (BMI) of 40.0 to 44.9 in adult (HCC)  E66.01    Z68.41   5. Former smoker  Z87.891      RECOMMENDATIONS: Jeannine BogaKaren D Redlich is a 70 y.o. female whose past medical history and cardiac risk factors include: Atrial fibrillation status post cardioversion x2, hypertension, history of congestive heart failure, former smoker, postmenopausal  female, advanced age, obesity due to excess calories.  Paroxysmal atrial fibrillation:   Rate control: Metoprolol.   Rhythm control: N/A  Thromboembolic prophylaxis: Eliquis  Patient had history of cardioversion x2.  And no prior history of atrial fibrillation ablation.  CHA2DS2-VASc SCORE is 3 which correlates to 3.2% risk of stroke per year (hypertension, age, female gender).  She does not want to have a stress test (GXT or pharmacological).   Long-term oral anticoagulation:  Indication: Paroxysmal atrial fibrillation.  Patient does not endorse any evidence of bleeding.  Labs from 04/21/2020 reviewed.  Hemoglobin relatively stable at 14.8 g/dL and kidney function within normal limits.  Benign essential hypertension:  Office blood pressure not well controlled.   Patient does not check her BP at home. I have encouraged her to do this given her office reading.   She is asked to keep a log of her BP and review it w/ her PCP to see if additional medical therapy is warrented.   Low salt diet.   Currently managed by primary care provider.  Obesity, due to excess calories: Body mass index is 44.92 kg/m. . I reviewed with the patient the importance of diet, regular physical activity/exercise, weight loss.   . Patient is educated on increasing physical activity gradually as tolerated.  With the goal of moderate intensity exercise for 30 minutes a day 5 days a  week.  FINAL MEDICATION LIST END OF ENCOUNTER: No orders of the defined types were placed in this encounter.   Medications Discontinued During This Encounter  Medication Reason  . furosemide (LASIX) 20 MG tablet Patient Preference     Current Outpatient Medications:  .  Ascorbic Acid (VITAMIN C) 1000 MG tablet, Take 2,000 mg by mouth daily., Disp: , Rfl:  .  B Complex Vitamins (B COMPLEX PO), Take 1 tablet by mouth 2 (two) times daily., Disp: , Rfl:  .  COENZYME Q-10 PO, Take 1 capsule by mouth 4 (four) times daily., Disp: , Rfl:  .  ELIQUIS 5 MG TABS tablet, TAKE 1 TABLET(5 MG) BY MOUTH TWICE DAILY, Disp: 60 tablet, Rfl: 6 .  losartan (COZAAR) 25 MG tablet, TAKE 1 TABLET(25 MG) BY MOUTH IN THE MORNING, Disp: 90 tablet, Rfl: 1 .  MAGNESIUM PO, Take 1,000 mg by mouth daily., Disp: , Rfl:  .  metoprolol succinate (TOPROL-XL) 50 MG 24 hr tablet, Take 1 tablet (50 mg total) by mouth daily. Hold if top blood pressure number less than 100 mmHg or heart rate less than 60 bpm., Disp: 90 tablet, Rfl: 0 .  Misc Natural Products (TURMERIC CURCUMIN) CAPS, Take 2 capsules by mouth 2 (two) times daily., Disp: , Rfl:  .  NON FORMULARY, Take 2 capsules by mouth daily. Taurine 1,000 mg, Disp: , Rfl:  .  OVER THE COUNTER MEDICATION, Beet Root: Take 1 capsule by mouth four times a day, Disp: , Rfl:  .  OVER THE COUNTER MEDICATION, Ashwaganda capsules: Take 2 capsules by mouth two times a day, Disp: , Rfl:  .  POTASSIUM CHLORIDE PO, Take 1 tablet by mouth daily. Not sure the dose, Disp: , Rfl:  .  HAWTHORNE BERRY PO, Take 1 capsule by mouth 2 (two) times daily., Disp: , Rfl:   No orders of the defined types were placed in this encounter.   There are no Patient Instructions on file for this visit.   --Continue cardiac medications as reconciled in final medication list. --Return in about 3 months (around 08/08/2020) for Follow up,  A. fib. Or sooner if needed. --Continue follow-up with your primary care  physician regarding the management of your other chronic comorbid conditions.  Patient's questions and concerns were addressed to her satisfaction. She voices understanding of the instructions provided during this encounter.   This note was created using a voice recognition software as a result there may be grammatical errors inadvertently enclosed that do not reflect the nature of this encounter. Every attempt is made to correct such errors.  Tessa Lerner, [place], [facility]  Pager: [phone] Office: [phone]

## 2020-05-24 ENCOUNTER — Other Ambulatory Visit: Payer: Self-pay | Admitting: Cardiology

## 2020-05-24 DIAGNOSIS — I10 Essential (primary) hypertension: Secondary | ICD-10-CM

## 2020-06-24 ENCOUNTER — Other Ambulatory Visit: Payer: Self-pay | Admitting: Cardiology

## 2020-06-24 DIAGNOSIS — I480 Paroxysmal atrial fibrillation: Secondary | ICD-10-CM

## 2020-06-26 ENCOUNTER — Other Ambulatory Visit: Payer: Self-pay

## 2020-08-08 ENCOUNTER — Ambulatory Visit: Payer: PPO | Admitting: Cardiology

## 2020-08-23 ENCOUNTER — Other Ambulatory Visit: Payer: Self-pay | Admitting: Cardiology

## 2020-08-23 DIAGNOSIS — Z7901 Long term (current) use of anticoagulants: Secondary | ICD-10-CM

## 2020-09-22 ENCOUNTER — Other Ambulatory Visit: Payer: Self-pay | Admitting: Cardiology

## 2020-09-22 DIAGNOSIS — I480 Paroxysmal atrial fibrillation: Secondary | ICD-10-CM

## 2020-09-28 DIAGNOSIS — M9903 Segmental and somatic dysfunction of lumbar region: Secondary | ICD-10-CM | POA: Diagnosis not present

## 2020-09-28 DIAGNOSIS — M5442 Lumbago with sciatica, left side: Secondary | ICD-10-CM | POA: Diagnosis not present

## 2020-09-29 DIAGNOSIS — M5442 Lumbago with sciatica, left side: Secondary | ICD-10-CM | POA: Diagnosis not present

## 2020-09-29 DIAGNOSIS — M9903 Segmental and somatic dysfunction of lumbar region: Secondary | ICD-10-CM | POA: Diagnosis not present

## 2020-10-03 DIAGNOSIS — M9903 Segmental and somatic dysfunction of lumbar region: Secondary | ICD-10-CM | POA: Diagnosis not present

## 2020-10-03 DIAGNOSIS — M5442 Lumbago with sciatica, left side: Secondary | ICD-10-CM | POA: Diagnosis not present

## 2020-10-05 DIAGNOSIS — M5442 Lumbago with sciatica, left side: Secondary | ICD-10-CM | POA: Diagnosis not present

## 2020-10-05 DIAGNOSIS — M9903 Segmental and somatic dysfunction of lumbar region: Secondary | ICD-10-CM | POA: Diagnosis not present

## 2020-10-10 DIAGNOSIS — M9903 Segmental and somatic dysfunction of lumbar region: Secondary | ICD-10-CM | POA: Diagnosis not present

## 2020-10-10 DIAGNOSIS — M5442 Lumbago with sciatica, left side: Secondary | ICD-10-CM | POA: Diagnosis not present

## 2020-10-12 DIAGNOSIS — M5442 Lumbago with sciatica, left side: Secondary | ICD-10-CM | POA: Diagnosis not present

## 2020-10-12 DIAGNOSIS — M9903 Segmental and somatic dysfunction of lumbar region: Secondary | ICD-10-CM | POA: Diagnosis not present

## 2020-10-17 DIAGNOSIS — M5442 Lumbago with sciatica, left side: Secondary | ICD-10-CM | POA: Diagnosis not present

## 2020-10-17 DIAGNOSIS — M9903 Segmental and somatic dysfunction of lumbar region: Secondary | ICD-10-CM | POA: Diagnosis not present

## 2020-10-19 DIAGNOSIS — M9903 Segmental and somatic dysfunction of lumbar region: Secondary | ICD-10-CM | POA: Diagnosis not present

## 2020-10-19 DIAGNOSIS — M5442 Lumbago with sciatica, left side: Secondary | ICD-10-CM | POA: Diagnosis not present

## 2020-10-24 ENCOUNTER — Ambulatory Visit: Payer: PPO | Admitting: Cardiology

## 2020-10-24 ENCOUNTER — Encounter: Payer: Self-pay | Admitting: Cardiology

## 2020-10-24 ENCOUNTER — Other Ambulatory Visit: Payer: Self-pay

## 2020-10-24 VITALS — BP 150/117 | HR 97 | Resp 16 | Ht 67.0 in | Wt 266.0 lb

## 2020-10-24 DIAGNOSIS — I10 Essential (primary) hypertension: Secondary | ICD-10-CM | POA: Diagnosis not present

## 2020-10-24 DIAGNOSIS — Z7901 Long term (current) use of anticoagulants: Secondary | ICD-10-CM

## 2020-10-24 DIAGNOSIS — Z87891 Personal history of nicotine dependence: Secondary | ICD-10-CM | POA: Diagnosis not present

## 2020-10-24 DIAGNOSIS — I480 Paroxysmal atrial fibrillation: Secondary | ICD-10-CM

## 2020-10-24 DIAGNOSIS — Z6841 Body Mass Index (BMI) 40.0 and over, adult: Secondary | ICD-10-CM | POA: Diagnosis not present

## 2020-10-24 MED ORDER — METOPROLOL SUCCINATE ER 100 MG PO TB24
100.0000 mg | ORAL_TABLET | Freq: Every day | ORAL | 1 refills | Status: DC
Start: 2020-10-24 — End: 2021-02-13

## 2020-10-24 NOTE — Progress Notes (Signed)
ID:  Briana Kane, DOB 09-27-1950, MRN [id number]  PCP:  Dois Davenport, MD  Cardiologist:  Tessa Lerner, DO, [facility] (established care 07/15/2019) Former Cardiology Providers: Dr. Clifton James (consult 01/2016) Electrophysiologist: Dr. Elberta Fortis  Date: 10/24/20 Last Office Visit: 05/09/2020   Chief Complaint  Patient presents with   Paroxysmal atrial fibrillation    Follow-up    HPI  Briana Kane is a 70 y.o. female who presents to the office with a chief complaint of " 66-month follow-up for atrial fibrillation management." Patient's past medical history and cardiovascular risk factors include: Atrial fibrillation status post cardioversion x2, hypertension, history of congestive heart failure, former smoker, postmenopausal female, advanced age, obesity due to excess calories.  Patient was under the care of multiple cardiovascular physicians in the past and was referred to the office by the ER at [facility] for management of atrial fibrillation.  Per patient diagnosed with atrial fibrillation back in 2018 and was also being treated for HFrEF secondary to tachycardia and A. fib with RVR.  She has undergone cardioversion x2 in the past without any radiofrequency ablations.  She was on amiodarone for rhythm control management; however opted out of it due to side effect profile.  Since then the plan of care has been geared toward rate control strategy and thromboembolic prophylaxis given her CHA2DS2-VASc score.  She presents for 21-month follow-up visit.  Patient states that she is feeling great; however, her blood pressures are not well controlled and she is in A. fib today.  She is tolerating oral anticoagulation well without any side effects or intolerances.  She does not endorse any evidence of bleeding.  No hospitalizations or urgent care visits since last office encounter.  Last blood work checked approximately 6 months ago in April 2022.    FUNCTIONAL STATUS: No  structured exercise program or daily routine.    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   B Complex Vitamins (B COMPLEX PO) Take 1 tablet by mouth 2 (two) times daily.   COENZYME Q-10 PO Take 1 capsule by mouth 4 (four) times daily.   ELIQUIS 5 MG TABS tablet TAKE 1 TABLET(5 MG) BY MOUTH TWICE DAILY   HAWTHORNE BERRY PO Take 1 capsule by mouth 2 (two) times daily.   losartan (COZAAR) 25 MG tablet TAKE 1 TABLET(25 MG) BY MOUTH IN THE MORNING   MAGNESIUM PO Take 1,000 mg by mouth daily.   metoprolol succinate (TOPROL-XL) 100 MG 24 hr tablet Take 1 tablet (100 mg total) by mouth daily. Take with or immediately following a meal.   Misc Natural Products (TURMERIC CURCUMIN) CAPS Take 2 capsules by mouth 2 (two) times daily.   NON FORMULARY Take 2 capsules by mouth daily. Taurine 1,000 mg   OVER  THE COUNTER MEDICATION Beet Root: Take 1 capsule by mouth four times a day   OVER THE COUNTER MEDICATION Ashwaganda capsules: Take 2 capsules by mouth two times a day   POTASSIUM CHLORIDE PO Take 1 tablet by mouth daily. Not sure the dose   [DISCONTINUED] metoprolol succinate (TOPROL-XL) 50 MG 24 hr tablet TAKE 1 TABLET BY MOUTH DAILY. HOLD IF TOP BLOOD PRESSURE NUMBER IS LESS THAN OR HEART RATE LESS THAN 60BPM.     PAST MEDICAL HISTORY: Past Medical  History:  Diagnosis Date   Acute systolic CHF (congestive heart failure) (HCC) 02/02/2016   Atrial fibrillation with RVR (HCC) 01/29/2016   Community acquired pneumonia 01/29/2016   Dyspnea    Hypertension    Morbid obesity with BMI of 45.0-49.9, adult (HCC) 02/02/2016    PAST SURGICAL HISTORY: Past Surgical History:  Procedure Laterality Date   CARDIOVERSION N/A 02/01/2016   Procedure: CARDIOVERSION;  Surgeon: [name], MD;  Location[facility] ENDOSCOPY;  Service: Cardiovascular;  Laterality: N/A;   CARDIOVERSION N/A 02/26/2016   Procedure: CARDIOVERSION;  Surgeon: Jake Bathe, MD;  Location: [facility] ENDOSCOPY;  Service: Cardiovascular;  Laterality: N/A;   TEE WITHOUT CARDIOVERSION N/A 02/01/2016   Procedure: TRANSESOPHAGEAL ECHOCARDIOGRAM (TEE);  Surgeon: [name], MD;  Location: [facility] ENDOSCOPY;  Service: Cardiovascular;  Laterality: N/A;    FAMILY HISTORY: The patient family history includes Hypertension in her mother.  SOCIAL HISTORY:  The patient  reports that she quit smoking about 30 years ago. Her smoking use included cigarettes. She has a 20.00 pack-year smoking history. She has never used smokeless tobacco. She reports current alcohol use. She reports that she does not use drugs.  REVIEW OF SYSTEMS: Review of Systems  Constitutional: Negative for chills and fever.  HENT:  Negative for hoarse voice and nosebleeds.   Eyes:  Negative for discharge, double vision and pain.  Cardiovascular:  Negative for chest pain, claudication, dyspnea on exertion, leg swelling, near-syncope, orthopnea, palpitations, paroxysmal nocturnal dyspnea and syncope.  Respiratory:  Negative for hemoptysis and shortness of breath.   Musculoskeletal:  Negative for muscle cramps and myalgias.  Gastrointestinal:  Negative for abdominal pain, constipation, diarrhea, hematemesis, hematochezia, melena, nausea and vomiting.  Neurological:  Negative for dizziness and light-headedness.   PHYSICAL EXAM: Vitals  with BMI 10/24/2020 10/24/2020 05/09/2020  Height - 5\' 7"  -  Weight - 266 lbs -  BMI - 41.65 -  Systolic 150 175  Diastolic 117 140 80  Pulse 97 782 -    CONSTITUTIONAL: Well-developed and well-nourished. No acute distress.  SKIN: Skin is warm and dry. No rash noted. No cyanosis. No pallor. No jaundice HEAD: Normocephalic and atraumatic.  EYES: No scleral icterus MOUTH/THROAT: Moist oral membranes.  NECK: No JVD present. No thyromegaly noted. No carotid bruits  LYMPHATIC: No visible cervical adenopathy.  CHEST Normal respiratory effort. No intercostal retractions  LUNGS: Clear to auscultation bilaterally.  No stridor. No wheezes. No rales.  CARDIOVASCULAR: Regular rate and rhythm, positive S1-S2, no murmurs rubs or gallops appreciated ABDOMINAL: Obese, soft, nontender, distended, positive bowel sounds in all 4 quadrants.  No apparent ascites.  EXTREMITIES: +1 bilateral peripheral edema  HEMATOLOGIC: No significant bruising NEUROLOGIC: Oriented to person, place, and time. Nonfocal. Normal muscle tone.  PSYCHIATRIC: Normal mood and affect. Normal behavior. Cooperative  CARDIAC DATABASE: EKG: 07/15/2019: Sinus  Rhythm, LAFB, left axis deviation, old anteroseptal infarct, poor R wave progression, without underlying ischemia or injury pattern.  10/24/2020: Atrial fibrillation, 110bpm, left axis  deviation, left anterior fascicular block, poor R wave progression, without underlying injury pattern.  Echocardiogram: 1/101/2018: LVEF severely reduced, 25-30%, diffuse hypokinesis, moderate MR, moderately dilated left atrium, mildly dilated RV with reduced systolic function, moderately dilated right atrium, moderate TR.  06/24/2016: LVEF 55%, normal wall motion, elevated LVEDP and LAP, mildly dilated left atrium.  07/23/2019:  Normal LV systolic function with EF 55%. Left ventricle cavity is normal in size. Normal left ventricular wall thickness. Normal global wall motion. Indeterminate  diastolic filling pattern due to E:A fusion. Patient was tachycardic throughout the study.  Structurally normal mitral valve.  Mild (Grade I) mitral regurgitation.  Structurally normal tricuspid valve.  Mild tricuspid regurgitation. No  evidence of pulmonary hypertension. RVSP measures 30 mmHg.  Stress Testing: None  Heart Catheterization: None  LABORATORY DATA: CBC Latest Ref Rng & Units 04/21/2020 06/18/2019 02/19/2016  WBC 4.0 - 10.5 K/uL - 7.9 7.2  Hemoglobin 11.1 - 15.9 g/dL 16.1 15.2(H) 15.5  Hematocrit 34.0 - 46.6 % 45.2 46.1(H) 47.6(H)  Platelets 150 - 400 K/uL - 258 245    CMP Latest Ref Rng & Units 04/21/2020 07/30/2019 06/18/2019  Glucose 65 - 99 mg/dL 93 096(E) 454(U)  BUN 8 - 27 mg/dL 20 18 13   Creatinine 0.57 - 1.00 mg/dL 9.81 1.91 4.78  Sodium 134 - 144 mmol/L 141 139 139  Potassium 3.5 - 5.2 mmol/L 4.8 4.6 4.7  Chloride 96 - 106 mmol/L 102 100 103  CO2 20 - 29 mmol/L 22 22 23   Calcium 8.7 - 10.3 mg/dL 9.5 9.6 9.5  Total Protein 6.5 - 8.1 g/dL - - -  Total Bilirubin 0.3 - 1.2 mg/dL - - -  Alkaline Phos 38 - 126 U/L - - -  AST 15 - 41 U/L - - -  ALT 14 - 54 U/L - - -    Lipid Panel     Component Value Date/Time   CHOL 107 01/30/2016 0229   TRIG 61 01/30/2016 0229   HDL 41 01/30/2016 0229   CHOLHDL 2.6 01/30/2016 0229   VLDL 12 01/30/2016 0229   LDLCALC 54 01/30/2016 0229    No components found for: NTPROBNP No results for input(s): PROBNP in the last 8760 hours.  No results for input(s): TSH in the last 8760 hours.   BMP Recent Labs    04/21/20 1315  NA 141  K 4.8  CL 102  CO2 22  GLUCOSE 93  BUN 20  CREATININE 0.80  CALCIUM 9.5    HEMOGLOBIN A1C Lab Results  Component Value Date   HGBA1C 6.0 (H) 01/30/2016   MPG 126 01/30/2016    IMPRESSION:    ICD-10-CM   1. Paroxysmal atrial fibrillation (HCC)  I48.0 EKG 12-Lead    metoprolol succinate (TOPROL-XL) 100 MG 24 hr tablet    2. Long term (current) use of anticoagulants  Z79.01  Hemoglobin and hematocrit, blood    Basic metabolic panel    3. Benign hypertension  I10     4. Class 3 severe obesity due to excess calories with serious comorbidity and body mass index (BMI) of 40.0 to 44.9 in adult (HCC)  E66.01    Z68.41     5. Former smoker  Z87.891        RECOMMENDATIONS: Briana Kane is a 70 y.o. female whose past medical history and cardiac risk factors include: Atrial fibrillation status post cardioversion x2, hypertension, history of congestive heart failure, former smoker, postmenopausal female, advanced age, obesity due to excess calories.  Paroxysmal atrial fibrillation:  Rate control: Metoprolol.  Rhythm control: N/A Thromboembolic prophylaxis: Eliquis Patient had history of cardioversion x2.  And no prior history of atrial fibrillation ablation. CHA2DS2-VASc SCORE is 3 which correlates to 3.2% risk of stroke per year (hypertension, age, female gender). Increase metoprolol to 100 mg p.o. daily. Patient does not want to consider antiarrhythmic medications.  We discussed following up in 4 weeks and if she remains in atrial fibrillation to consider cardioversion.  However she would like to hold off on cardioversion and focus on rate control strategy. She does not want to have a stress test (GXT or pharmacological).   Long-term oral anticoagulation: Indication: Paroxysmal atrial fibrillation. Patient does not endorse any evidence of bleeding. Check hemoglobin hematocrit, BNP.  Benign essential hypertension: Office blood pressure not well controlled.  Patient states that home blood pressures are better controlled. I have asked her to reach out to either myself or her PCP if her systolic blood pressures are consistently greater than 140 mmHg at home. Low salt diet.  Currently managed by primary care provider.  Obesity, due to excess calories: Body mass index is 41.66 kg/m. I reviewed with the patient the importance of diet, regular physical  activity/exercise, weight loss.   Patient is educated on increasing physical activity gradually as tolerated.  With the goal of moderate intensity exercise for 30 minutes a day 5 days a week.  FINAL MEDICATION LIST END OF ENCOUNTER: Meds ordered this encounter  Medications   metoprolol succinate (TOPROL-XL) 100 MG 24 hr tablet    Sig: Take 1 tablet (100 mg total) by mouth daily. Take with or immediately following a meal.    Dispense:  90 tablet    Refill:  1     Medications Discontinued During This Encounter  Medication Reason   metoprolol succinate (TOPROL-XL) 50 MG 24 hr tablet Dose change      Current Outpatient Medications:    Ascorbic Acid (VITAMIN C) 1000 MG tablet, Take 2,000 mg by mouth daily., Disp: , Rfl:    B Complex Vitamins (B COMPLEX PO), Take 1 tablet by mouth 2 (two) times daily., Disp: , Rfl:    COENZYME Q-10 PO, Take 1 capsule by mouth 4 (four) times daily., Disp: , Rfl:    ELIQUIS 5 MG TABS tablet, TAKE 1 TABLET(5 MG) BY MOUTH TWICE DAILY, Disp: 60 tablet, Rfl: 6   HAWTHORNE BERRY PO, Take 1 capsule by mouth 2 (two) times daily., Disp: , Rfl:    losartan (COZAAR) 25 MG tablet, TAKE 1 TABLET(25 MG) BY MOUTH IN THE MORNING, Disp: 90 tablet, Rfl: 1   MAGNESIUM PO, Take 1,000 mg by mouth daily., Disp: , Rfl:    metoprolol succinate (TOPROL-XL) 100 MG 24 hr tablet, Take 1 tablet (100 mg total) by mouth daily. Take with or immediately following a meal., Disp: 90 tablet, Rfl: 1   Misc Natural Products (TURMERIC CURCUMIN) CAPS, Take 2 capsules by mouth 2 (two) times daily., Disp: , Rfl:    NON FORMULARY, Take 2 capsules by mouth daily. Taurine 1,000 mg, Disp: , Rfl:    OVER THE COUNTER MEDICATION, Beet Root: Take 1 capsule by mouth four times a day, Disp: , Rfl:    OVER THE COUNTER MEDICATION, Ashwaganda capsules: Take 2 capsules by mouth two times a day, Disp: , Rfl:    POTASSIUM CHLORIDE PO, Take 1 tablet by mouth daily. Not sure the dose, Disp: , Rfl:   Orders Placed  This Encounter  Procedures  Hemoglobin and hematocrit, blood   Basic metabolic panel   EKG 12-Lead    There are no Patient Instructions on file for this visit.   --Continue cardiac medications as reconciled in final medication list. --Return in about 6 months (around 04/24/2021) for Follow up, A. fib, EKG on arrival.. Or sooner if needed. --Continue follow-up with your primary care physician regarding the management of your other chronic comorbid conditions.  Patient's questions and concerns were addressed to her satisfaction. She voices understanding of the instructions provided during this encounter.   This note was created using a voice recognition software as a result there may be grammatical errors inadvertently enclosed that do not reflect the nature of this encounter. Every attempt is made to correct such errors.  Tessa Lerner, [place], [facility]  Pager: [phone] Office: [phone]

## 2020-10-26 DIAGNOSIS — M9903 Segmental and somatic dysfunction of lumbar region: Secondary | ICD-10-CM | POA: Diagnosis not present

## 2020-10-26 DIAGNOSIS — M5442 Lumbago with sciatica, left side: Secondary | ICD-10-CM | POA: Diagnosis not present

## 2020-10-31 DIAGNOSIS — M9903 Segmental and somatic dysfunction of lumbar region: Secondary | ICD-10-CM | POA: Diagnosis not present

## 2020-10-31 DIAGNOSIS — M5442 Lumbago with sciatica, left side: Secondary | ICD-10-CM | POA: Diagnosis not present

## 2020-11-07 DIAGNOSIS — M9903 Segmental and somatic dysfunction of lumbar region: Secondary | ICD-10-CM | POA: Diagnosis not present

## 2020-11-07 DIAGNOSIS — M5442 Lumbago with sciatica, left side: Secondary | ICD-10-CM | POA: Diagnosis not present

## 2020-11-14 DIAGNOSIS — M5442 Lumbago with sciatica, left side: Secondary | ICD-10-CM | POA: Diagnosis not present

## 2020-11-14 DIAGNOSIS — M9903 Segmental and somatic dysfunction of lumbar region: Secondary | ICD-10-CM | POA: Diagnosis not present

## 2020-11-17 DIAGNOSIS — Z7901 Long term (current) use of anticoagulants: Secondary | ICD-10-CM | POA: Diagnosis not present

## 2020-11-18 LAB — BASIC METABOLIC PANEL
BUN/Creatinine Ratio: 20 (ref 12–28)
BUN: 16 mg/dL (ref 8–27)
CO2: 21 mmol/L (ref 20–29)
Calcium: 9.4 mg/dL (ref 8.7–10.3)
Chloride: 102 mmol/L (ref 96–106)
Creatinine, Ser: 0.79 mg/dL (ref 0.57–1.00)
Glucose: 109 mg/dL — ABNORMAL HIGH (ref 70–99)
Potassium: 5 mmol/L (ref 3.5–5.2)
Sodium: 139 mmol/L (ref 134–144)
eGFR: 81 mL/min/{1.73_m2} (ref >59)

## 2020-11-18 LAB — HEMOGLOBIN AND HEMATOCRIT, BLOOD
Hematocrit: 45.4 % (ref 34.0–46.6)
Hemoglobin: 15.3 g/dL (ref 11.1–15.9)

## 2020-11-21 DIAGNOSIS — M5442 Lumbago with sciatica, left side: Secondary | ICD-10-CM | POA: Diagnosis not present

## 2020-11-21 DIAGNOSIS — M9903 Segmental and somatic dysfunction of lumbar region: Secondary | ICD-10-CM | POA: Diagnosis not present

## 2020-11-21 NOTE — Progress Notes (Signed)
Called pt no answer, left a vm to return the call back

## 2020-11-22 NOTE — Progress Notes (Signed)
Called pt no answer, left a vm to return the call back

## 2020-11-28 NOTE — Progress Notes (Signed)
Called pt three times no answer, left another vm to call back.

## 2020-12-12 DIAGNOSIS — M9903 Segmental and somatic dysfunction of lumbar region: Secondary | ICD-10-CM | POA: Diagnosis not present

## 2020-12-12 DIAGNOSIS — M5442 Lumbago with sciatica, left side: Secondary | ICD-10-CM | POA: Diagnosis not present

## 2020-12-21 ENCOUNTER — Other Ambulatory Visit: Payer: Self-pay | Admitting: Cardiology

## 2020-12-21 DIAGNOSIS — I480 Paroxysmal atrial fibrillation: Secondary | ICD-10-CM

## 2020-12-26 DIAGNOSIS — M5442 Lumbago with sciatica, left side: Secondary | ICD-10-CM | POA: Diagnosis not present

## 2020-12-26 DIAGNOSIS — M9903 Segmental and somatic dysfunction of lumbar region: Secondary | ICD-10-CM | POA: Diagnosis not present

## 2021-02-13 ENCOUNTER — Other Ambulatory Visit: Payer: Self-pay | Admitting: Cardiology

## 2021-02-13 DIAGNOSIS — I480 Paroxysmal atrial fibrillation: Secondary | ICD-10-CM

## 2021-03-15 ENCOUNTER — Other Ambulatory Visit: Payer: Self-pay | Admitting: Cardiology

## 2021-03-15 DIAGNOSIS — I10 Essential (primary) hypertension: Secondary | ICD-10-CM

## 2021-03-15 DIAGNOSIS — I480 Paroxysmal atrial fibrillation: Secondary | ICD-10-CM

## 2021-04-24 ENCOUNTER — Ambulatory Visit: Payer: PPO | Admitting: Cardiology

## 2021-04-24 ENCOUNTER — Encounter: Payer: Self-pay | Admitting: Cardiology

## 2021-04-24 VITALS — BP 158/75 | HR 131 | Temp 97.6°F | Resp 14 | Ht 67.0 in | Wt 268.0 lb

## 2021-04-24 DIAGNOSIS — I4819 Other persistent atrial fibrillation: Secondary | ICD-10-CM

## 2021-04-24 DIAGNOSIS — I480 Paroxysmal atrial fibrillation: Secondary | ICD-10-CM

## 2021-04-24 DIAGNOSIS — I10 Essential (primary) hypertension: Secondary | ICD-10-CM

## 2021-04-24 DIAGNOSIS — Z7901 Long term (current) use of anticoagulants: Secondary | ICD-10-CM

## 2021-04-24 DIAGNOSIS — Z87891 Personal history of nicotine dependence: Secondary | ICD-10-CM

## 2021-04-24 MED ORDER — METOPROLOL SUCCINATE ER 100 MG PO TB24: 100.0000 mg | ORAL_TABLET | Freq: Every day | ORAL | 0 refills | Status: DC

## 2021-04-24 NOTE — Progress Notes (Signed)
? ?ID:  Briana Kane, DOB 02/16/1950, MRN [id number] ? ?PCP:  Dois Davenport, MD  ?Cardiologist:  Tessa Lerner, DO, [facility] (established care 07/15/2019) ?Former Cardiology Providers: Dr. Clifton James (consult 01/2016) ?Electrophysiologist: Dr. Elberta Fortis ? ?Date: 04/24/21 ?Last Office Visit: 10/24/2020 ? ?Chief Complaint  ?Patient presents with  ? Atrial Fibrillation  ? Follow-up  ?  6 months  ? ? ?HPI  ?Briana Kane is a 71 y.o. female whose past medical history and cardiovascular risk factors include: Persistent atrial fibrillation status post cardioversion x2, hypertension, history of congestive heart failure, former smoker, postmenopausal female, advanced age, obesity due to excess calories. ? 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  She is undergone cardioversion x2 in the past.  She was on amiodarone for rhythm control but opted out given the side effect profile of the medication.  Since then patient has been focus on rate control strategy and currently on oral anticoagulation for thromboembolic prophylaxis given her CHA2DS2-VASc score. ? ?Of note, patient has been under the care of multiple cardiovascular providers in the past and was referred to the practice by the ED physician for A-fib management.  She now presents for 66-month follow-up visit.  Clinically patient states " feels awesome."  But she remains in A-fib for what appears to be the last 6 months based on the EKGs.  ? ?We discussed uptitration of metoprolol to 100 mg p.o. daily at the last visit for reasons unknown she is currently taking 50 mg p.o. daily.  In addition we discussed the importance of restoring normal sinus rhythm with antiarrhythmic medications.  The patient has refused in the past as well as today.  Patient is considering atrial fibrillation ablation as an alternative.  She does not endorse evidence of bleeding. ? ?FUNCTIONAL STATUS: ?No  structured exercise program or daily routine.   ? ?ALLERGIES: ?Allergies  ?Allergen Reactions  ? Gluten Meal Shortness Of Breath and Other (See Comments)  ?  Triggered fluid around her heart a couple of years ago ?(NO BREAD OR PASTA)  ? Wheat Bran Shortness Of Breath  ?  Also, possible edema (around heart)  ? Amiodarone Other (See Comments)  ?  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 Mold Extract [Trichophyton] Other (See Comments)  ?  Causes congestion (sudden)  ? Neosporin [Neomycin-Bacitracin Zn-Polymyx] Rash  ? Other Rash  ?  Neoprene (NO!)  ? Petroleum Jelly Lip Treatment [Dimethicone] Rash  ? Tape Rash  ? ? ?MEDICATION LIST PRIOR TO VISIT: ?Current Meds  ?Medication Sig  ? Ascorbic Acid (VITAMIN C) 1000 MG tablet Take 2,000 mg by mouth daily.  ? B Complex Vitamins (B COMPLEX PO) Take 1 tablet by mouth 2 (two) times daily.  ? COENZYME Q-10 PO Take 1 capsule by mouth 4 (four) times daily.  ? ELIQUIS 5 MG TABS tablet TAKE 1 TABLET(5 MG) BY MOUTH TWICE DAILY  ? HAWTHORNE BERRY PO Take 1 capsule by mouth 2 (two) times daily.  ? losartan (COZAAR) 25 MG tablet TAKE 1 TABLET(25 MG) BY MOUTH IN THE MORNING  ? MAGNESIUM PO Take 1,000 mg by mouth daily.  ? Misc Natural Products (TURMERIC CURCUMIN) CAPS Take 2 capsules by mouth 2 (two) times daily.  ? NON FORMULARY Take 2 capsules by mouth daily. Taurine 1,000 mg  ? OVER THE COUNTER MEDICATION Beet Root: Take 1 capsule by mouth four times a day  ? OVER THE COUNTER MEDICATION Ashwaganda  capsules: Take 2 capsules by mouth two times a day  ? POTASSIUM CHLORIDE PO Take 1 tablet by mouth daily. Not sure the dose  ? [DISCONTINUED] metoprolol succinate (TOPROL-XL) 50 MG 24 hr tablet TAKE ONE TABLET BY MOUTH EVERY DAY. HOLD IF BLOOD PRESSURE IS LESS THAN 100 OR HEAT REAT IS LESS THAN 60 BEATS/MINUTE  ?  ? ?PAST MEDICAL HISTORY: ?Past Medical History:  ?Diagnosis Date  ? Acute systolic CHF (congestive heart failure) (HCC) 02/02/2016  ? Atrial fibrillation with RVR (HCC) 01/29/2016  ?  Community acquired pneumonia 01/29/2016  ? Dyspnea   ? Hypertension   ? Morbid obesity with BMI of 45.0-49.9, adult (HCC) 02/02/2016  ? ? ?PAST SURGICAL HISTORY: ?Past Surgical History:  ?Procedure Laterality Date  ? CARDIOVERSION N/A 02/01/2016  ? Procedure: CARDIOVERSION;  Surgeon: [name], MD;  Location: [facility] ENDOSCOPY;  Service: Cardiovascular;  Laterality: N/A;  ? CARDIOVERSION N/A 02/26/2016  ? Procedure: CARDIOVERSION;  Surgeon: Jake Bathe, MD;  Location: [facility] ENDOSCOPY;  Service: Cardiovascular;  Laterality: N/A;  ? TEE WITHOUT CARDIOVERSION N/A 02/01/2016  ? Procedure: TRANSESOPHAGEAL ECHOCARDIOGRAM (TEE);  Surgeon: [name], MD;  Location: [facility] ENDOSCOPY;  Service: Cardiovascular;  Laterality: N/A;  ? ? ?FAMILY HISTORY: ?The patient family history includes Hypertension in her mother. ? ?SOCIAL HISTORY:  ?The patient  reports that she quit smoking about 31 years ago. Her smoking use included cigarettes. She has a 20.00 pack-year smoking history. She has never used smokeless tobacco. She reports current alcohol use. She reports that she does not use drugs. ? ?REVIEW OF SYSTEMS: ?Review of Systems  ?Cardiovascular:  Negative for chest pain, dyspnea on exertion, irregular heartbeat, leg swelling, near-syncope, orthopnea, palpitations, paroxysmal nocturnal dyspnea and syncope.  ?Respiratory:  Negative for shortness of breath.   ? ?PHYSICAL EXAM: ? ?  04/24/2021  ?  2:33 PM 10/24/2020  ?  1:45 PM 10/24/2020  ?  1:39 PM  ?Vitals with BMI  ?Height 5\' 7"   5\' 7"   ?Weight 268 lbs  266 lbs  ?BMI 41.96  41.65  ?Systolic 158 150  ?Diastolic 75 117 140  ?Pulse 131 97 103  ? ? ?CONSTITUTIONAL: Well-developed and well-nourished. No acute distress.  ?SKIN: Skin is warm and dry. No rash noted. No cyanosis. No pallor. No jaundice ?HEAD: Normocephalic and atraumatic.  ?EYES: No scleral icterus ?MOUTH/THROAT: Moist oral membranes.  ?NECK: No JVD present. No thyromegaly noted. No carotid bruits  ?LYMPHATIC: No visible  cervical adenopathy.  ?CHEST Normal respiratory effort. No intercostal retractions  ?LUNGS: Clear to auscultation bilaterally.  No stridor. No wheezes. No rales.  ?CARDIOVASCULAR: Irregularly irregular, variable S1-S2, no murmurs rubs or gallops appreciated ?ABDOMINAL: Obese, soft, nontender, distended, positive bowel sounds in all 4 quadrants.  No apparent ascites.  ?EXTREMITIES: Trace bilateral peripheral edema  ?HEMATOLOGIC: No significant bruising ?NEUROLOGIC: Oriented to person, place, and time. Nonfocal. Normal muscle tone.  ?PSYCHIATRIC: Normal mood and affect. Normal behavior. Cooperative ? ?CARDIAC DATABASE: ?EKG: ?07/15/2019: Sinus  Rhythm, LAFB, left axis deviation, old anteroseptal infarct, poor R wave progression, without underlying ischemia or injury pattern. ? ?10/24/2020: Atrial fibrillation, 110bpm, left axis deviation, left anterior fascicular block, poor R wave progression, without underlying injury pattern. ? ?April 24, 2021: Atrial fibrillation, 127bpm, LAD, LAFB, poor R wave progression, without underlying ischemia injury pattern ? ?Echocardiogram: ?1/101/2018: LVEF severely reduced, 25-30%, diffuse hypokinesis, moderate MR, moderately dilated left atrium, mildly dilated RV with reduced systolic function, moderately dilated right atrium, moderate TR. ? ?06/24/2016:  LVEF 55%, normal wall motion, elevated LVEDP and LAP, mildly dilated left atrium. ? ?07/23/2019:  ?Normal LV systolic function with EF 55%. Left ventricle cavity is normal in size. Normal left ventricular wall thickness. Normal global wall motion. Indeterminate diastolic filling pattern due to E:A fusion. Patient was tachycardic throughout the study.  ?Structurally normal mitral valve.  Mild (Grade I) mitral regurgitation.  ?Structurally normal tricuspid valve.  Mild tricuspid regurgitation. No  ?evidence of pulmonary hypertension. RVSP measures 30 mmHg. ? ?Stress Testing: ?None ? ?Heart Catheterization: ?None ? ?LABORATORY DATA: ? ?   Latest Ref Rng & Units 11/17/2020  ?  1:25 PM 04/21/2020  ?  1:12 PM 06/18/2019  ?  5:30 PM  ?CBC  ?WBC 4.0 - 10.5 K/uL   7.9    ?Hemoglobin 11.1 - 15.9 g/dL 81.0   17.5   10.2    ?Hematocrit 34.0 - 46.6 % 45.4   45.

## 2021-05-08 ENCOUNTER — Encounter: Payer: Self-pay | Admitting: Cardiology

## 2021-05-13 ENCOUNTER — Other Ambulatory Visit: Payer: Self-pay | Admitting: Cardiology

## 2021-05-13 DIAGNOSIS — Z7901 Long term (current) use of anticoagulants: Secondary | ICD-10-CM

## 2021-05-22 ENCOUNTER — Ambulatory Visit: Payer: PPO | Admitting: Cardiology

## 2021-05-22 ENCOUNTER — Encounter: Payer: Self-pay | Admitting: Cardiology

## 2021-05-22 VITALS — BP 137/93 | HR 88 | Resp 16 | Ht 67.0 in | Wt 266.0 lb

## 2021-05-22 DIAGNOSIS — I10 Essential (primary) hypertension: Secondary | ICD-10-CM

## 2021-05-22 DIAGNOSIS — Z87891 Personal history of nicotine dependence: Secondary | ICD-10-CM

## 2021-05-22 DIAGNOSIS — I4819 Other persistent atrial fibrillation: Secondary | ICD-10-CM

## 2021-05-22 DIAGNOSIS — Z7901 Long term (current) use of anticoagulants: Secondary | ICD-10-CM

## 2021-05-22 NOTE — Progress Notes (Signed)
? ?ID:  Briana Kane, DOB 1950/07/20, MRN [id number] ? ?PCP:  Dois Davenport, MD  ?Cardiologist:  Tessa Lerner, DO, [facility] (established care 07/15/2019) ?Former Cardiology Providers: Dr. Clifton James (consult 01/2016) ?Electrophysiologist: Dr. Elberta Fortis ? ?Date: 05/22/21 ?Last Office Visit: 04/24/2021 ? ?Chief Complaint  ?Patient presents with  ? Atrial Fibrillation  ? Follow-up  ? ? ?HPI  ?Briana Kane is a 71 y.o. female whose past medical history and cardiovascular risk factors include: Persistent atrial fibrillation status post cardioversion x2, hypertension, history of congestive heart failure, former smoker, postmenopausal female, advanced age, obesity due to excess calories. ? 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  Patient has undergone cardioversion x2 in the past and was on amiodarone for short period of time prior to discontinuation due to medication profile.  Since then patient has been managed on rate control strategy and oral anticoagulation for thromboembolic prophylaxis given her CHA2DS2-VASc score. ? ?At last office visit recommended the importance of rhythm control.  Patient does not want to be on antiarrhythmic medications or undergo cardioversion again.  She is willing to follow-up with the EP to be considered for atrial fibrillation ablation.  She has an appointment with Dr. Lalla Brothers in June 2023 per patient. ? ?At last office visit we uptitrated her metoprolol to 100 mg p.o. daily her ventricular rate has improved but remains in A-fib at today's encounter.  Patient does not endorse any evidence of bleeding.  Denies angina pectoris or heart failure symptoms. ? ?FUNCTIONAL STATUS: ?No structured exercise program or daily routine.   ? ?ALLERGIES: ?Allergies  ?Allergen Reactions  ? Gluten Meal Shortness Of Breath and Other (See Comments)  ?  Triggered fluid around her heart a couple of years ago ?(NO BREAD OR  PASTA)  ? Wheat Bran Shortness Of Breath  ?  Also, possible edema (around heart)  ? Amiodarone Other (See Comments)  ?  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 Mold Extract [Trichophyton] Other (See Comments)  ?  Causes congestion (sudden)  ? Neosporin [Neomycin-Bacitracin Zn-Polymyx] Rash  ? Other Rash  ?  Neoprene (NO!)  ? Petroleum Jelly Lip Treatment [Dimethicone] Rash  ? Tape Rash  ? ? ?MEDICATION LIST PRIOR TO VISIT: ?Current Meds  ?Medication Sig  ? Ascorbic Acid (VITAMIN C) 1000 MG tablet Take 2,000 mg by mouth daily.  ? B Complex Vitamins (B COMPLEX PO) Take 1 tablet by mouth 2 (two) times daily.  ? COENZYME Q-10 PO Take 1 capsule by mouth 4 (four) times daily.  ? ELIQUIS 5 MG TABS tablet TAKE 1 TABLET(5 MG) BY MOUTH TWICE DAILY  ? HAWTHORNE BERRY PO Take 1 capsule by mouth 2 (two) times daily.  ? losartan (COZAAR) 25 MG tablet TAKE 1 TABLET(25 MG) BY MOUTH IN THE MORNING  ? MAGNESIUM PO Take 1,000 mg by mouth daily.  ? metoprolol succinate (TOPROL-XL) 100 MG 24 hr tablet Take 1 tablet (100 mg total) by mouth daily. Take with or immediately following a meal.  ? Misc Natural Products (TURMERIC CURCUMIN) CAPS Take 2 capsules by mouth 2 (two) times daily.  ? NON FORMULARY Take 2 capsules by mouth daily. Taurine 1,000 mg  ? OVER THE COUNTER MEDICATION Beet Root: Take 1 capsule by mouth four times a day  ? OVER THE COUNTER MEDICATION Ashwaganda capsules: Take 2 capsules by mouth two times a day  ? POTASSIUM CHLORIDE PO Take 1 tablet by mouth daily. Not sure  the dose  ?  ? ?PAST MEDICAL HISTORY: ?Past Medical History:  ?Diagnosis Date  ? Acute systolic CHF (congestive heart failure) (HCC) 02/02/2016  ? Atrial fibrillation with RVR (HCC) 01/29/2016  ? Community acquired pneumonia 01/29/2016  ? Dyspnea   ? Hypertension   ? Morbid obesity with BMI of 45.0-49.9, adult (HCC) 02/02/2016  ? ? ?PAST SURGICAL HISTORY: ?Past Surgical History:  ?Procedure Laterality Date  ? CARDIOVERSION N/A 02/01/2016  ? Procedure: CARDIOVERSION;   Surgeon: [name], MD;  Location: [facility] ENDOSCOPY;  Service: Cardiovascular;  Laterality: N/A;  ? CARDIOVERSION N/A 02/26/2016  ? Procedure: CARDIOVERSION;  Surgeon: Jake Bathe, MD;  Location: [facility] ENDOSCOPY;  Service: Cardiovascular;  Laterality: N/A;  ? TEE WITHOUT CARDIOVERSION N/A 02/01/2016  ? Procedure: TRANSESOPHAGEAL ECHOCARDIOGRAM (TEE);  Surgeon: [name], MD;  Location: [facility] ENDOSCOPY;  Service: Cardiovascular;  Laterality: N/A;  ? ? ?FAMILY HISTORY: ?The patient family history includes Hypertension in her mother. ? ?SOCIAL HISTORY:  ?The patient  reports that she quit smoking about 31 years ago. Her smoking use included cigarettes. She has a 20.00 pack-year smoking history. She has never used smokeless tobacco. She reports current alcohol use. She reports that she does not use drugs. ? ?REVIEW OF SYSTEMS: ?Review of Systems  ?Cardiovascular:  Negative for chest pain, dyspnea on exertion, irregular heartbeat, leg swelling, near-syncope, orthopnea, palpitations, paroxysmal nocturnal dyspnea and syncope.  ?Respiratory:  Negative for shortness of breath.   ? ?PHYSICAL EXAM: ? ?  05/22/2021  ?  3:08 PM 04/24/2021  ?  2:33 PM 10/24/2020  ?  1:45 PM  ?Vitals with BMI  ?Height 5\' 7"  5\' 7"    ?Weight 266 lbs 268 lbs   ?BMI 41.65 41.96   ?Systolic 137 158 626  ?Diastolic 93 75 117  ?Pulse 88 131 97  ? ? ?CONSTITUTIONAL: Well-developed and well-nourished. No acute distress.  ?SKIN: Skin is warm and dry. No rash noted. No cyanosis. No pallor. No jaundice ?HEAD: Normocephalic and atraumatic.  ?EYES: No scleral icterus ?MOUTH/THROAT: Moist oral membranes.  ?NECK: No JVD present. No thyromegaly noted. No carotid bruits  ?LYMPHATIC: No visible cervical adenopathy.  ?CHEST Normal respiratory effort. No intercostal retractions  ?LUNGS: Clear to auscultation bilaterally.  No stridor. No wheezes. No rales.  ?CARDIOVASCULAR: Irregularly irregular, variable S1-S2, no murmurs rubs or gallops appreciated ?ABDOMINAL: Obese,  soft, nontender, distended, positive bowel sounds in all 4 quadrants.  No apparent ascites.  ?EXTREMITIES: Trace bilateral peripheral edema  ?HEMATOLOGIC: No significant bruising ?NEUROLOGIC: Oriented to person, place, and time. Nonfocal. Normal muscle tone.  ?PSYCHIATRIC: Normal mood and affect. Normal behavior. Cooperative ?No significant change in physical examination since last office encounter. ? ?CARDIAC DATABASE: ?EKG: ?07/15/2019: Sinus  Rhythm, LAFB, left axis deviation, old anteroseptal infarct, poor R wave progression, without underlying ischemia or injury pattern. ? ?05/22/2021: A-fib, 114 bpm, left axis, left anterior fascicular block, consider old anteroseptal infarct ? ?Echocardiogram: ?1/101/2018: LVEF severely reduced, 25-30%, diffuse hypokinesis, moderate MR, moderately dilated left atrium, mildly dilated RV with reduced systolic function, moderately dilated right atrium, moderate TR. ? ?06/24/2016: LVEF 55%, normal wall motion, elevated LVEDP and LAP, mildly dilated left atrium. ? ?07/23/2019:  ?Normal LV systolic function with EF 55%. Left ventricle cavity is normal in size. Normal left ventricular wall thickness. Normal global wall motion. Indeterminate diastolic filling pattern due to E:A fusion. Patient was tachycardic throughout the study.  ?Structurally normal mitral valve.  Mild (Grade I) mitral regurgitation.  ?Structurally normal tricuspid valve.  Mild tricuspid regurgitation. No  ?evidence of pulmonary hypertension. RVSP measures 30 mmHg. ? ?Stress Testing: ?None ? ?Heart Catheterization: ?None ? ?LABORATORY DATA: ? ?  Latest Ref Rng & Units 11/17/2020  ?  1:25 PM 04/21/2020  ?  1:12 PM 06/18/2019  ?  5:30 PM  ?CBC  ?WBC 4.0 - 10.5 K/uL   7.9    ?Hemoglobin 11.1 - 15.9 g/dL 65.4   65.0   35.4    ?Hematocrit 34.0 - 46.6 % 45.4   45.2   46.1    ?Platelets 150 - 400 K/uL   258    ? ? ? ?  Latest Ref Rng & Units 11/17/2020  ?  1:25 PM 04/21/2020  ?  1:15 PM 07/30/2019  ?  1:57 PM  ?CMP  ?Glucose 70 -  99 mg/dL 656   93   812    ?BUN 8 - 27 mg/dL 16   20   18     ?Creatinine 0.57 - 1.00 mg/dL   7.51   7.00    ?Sodium 134 - 144 mmol/L 139   141   139    ?Potassium 3.5 - 5.2 mmol/L 5.0   4.8   4.6    ?Chlo

## 2021-06-27 ENCOUNTER — Encounter: Payer: Self-pay | Admitting: Cardiology

## 2021-06-27 ENCOUNTER — Ambulatory Visit (INDEPENDENT_AMBULATORY_CARE_PROVIDER_SITE_OTHER): Payer: PPO | Admitting: Cardiology

## 2021-06-27 VITALS — BP 150/84 | HR 138 | Ht 67.0 in | Wt 264.4 lb

## 2021-06-27 DIAGNOSIS — I4819 Other persistent atrial fibrillation: Secondary | ICD-10-CM | POA: Diagnosis not present

## 2021-06-27 DIAGNOSIS — Z6841 Body Mass Index (BMI) 40.0 and over, adult: Secondary | ICD-10-CM

## 2021-06-27 NOTE — Progress Notes (Signed)
Electrophysiology Office Note:    Date:  06/27/2021   ID:  Briana Kane, DOB 03/27/1950, MRN [id number]  PCP:  Dois Davenport, MD  [facility] Cardiologist:  None  [facility] Electrophysiologist:  Lanier Prude, MD   Referring MD: Tessa Lerner, DO   Chief Complaint: Atrial fibrillation  History of Present Illness:    Briana Kane is a 71 y.o. female who presents for an evaluation of atrial fibrillation at the request of Dr. Odis Hollingshead.  He last saw the patient May 22, 2021.  She has had 2 prior cardioversions for her atrial fibrillation.  Her medical history also includes heart failure, some previous tobacco abuse, obesity.  She was first diagnosed with atrial fibrillation in 2018.  She has been previously treated with amiodarone.  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  That took over a year to resolve.  Today the patient tells me that her heart rates at home run from 80 to 90 bpm.  She tolerates the Eliquis without any bleeding issues.    Past Medical History:  Diagnosis Date   Acute systolic CHF (congestive heart failure) (HCC) 02/02/2016   Atrial fibrillation with RVR (HCC) 01/29/2016   Community acquired pneumonia 01/29/2016   Dyspnea    Hypertension    Morbid obesity with BMI of 45.0-49.9, adult (HCC) 02/02/2016    Past Surgical History:  Procedure Laterality Date   CARDIOVERSION N/A 02/01/2016   Procedure: CARDIOVERSION;  Surgeon: [name], MD;  Location: [facility] ENDOSCOPY;  Service: Cardiovascular;  Laterality: N/A;   CARDIOVERSION N/A 02/26/2016   Procedure: CARDIOVERSION;  Surgeon: Jake Bathe, MD;  Location: [facility] ENDOSCOPY;  Service: Cardiovascular;  Laterality: N/A;   TEE WITHOUT CARDIOVERSION N/A 02/01/2016   Procedure: TRANSESOPHAGEAL ECHOCARDIOGRAM (TEE);  Surgeon: [name], MD;  Location: [facility] ENDOSCOPY;  Service: Cardiovascular;  Laterality: N/A;    Current Medications: Current Meds   Medication Sig   Ascorbic Acid (VITAMIN C) 1000 MG tablet Take 2,000 mg by mouth daily.   B Complex Vitamins (B COMPLEX PO) Take 1 tablet by mouth 2 (two) times daily.   COENZYME Q-10 PO Take 1 capsule by mouth 4 (four) times daily.   ELIQUIS 5 MG TABS tablet TAKE 1 TABLET(5 MG) BY MOUTH TWICE DAILY   HAWTHORNE BERRY PO Take 1 capsule by mouth 2 (two) times daily.   losartan (COZAAR) 25 MG tablet TAKE 1 TABLET(25 MG) BY MOUTH IN THE MORNING   MAGNESIUM PO Take 1,000 mg by mouth daily.   metoprolol succinate (TOPROL-XL) 100 MG 24 hr tablet Take 1 tablet (100 mg total) by mouth daily. Take with or immediately following a meal.   Misc Natural Products (TURMERIC CURCUMIN) CAPS Take 2 capsules by mouth 2 (two) times daily.   NON FORMULARY Take 2 capsules by mouth daily. Taurine 1,000 mg   OVER THE COUNTER MEDICATION Beet Root: Take 1 capsule by mouth four times a day   OVER THE COUNTER MEDICATION Ashwaganda capsules: Take 2 capsules by mouth two times a day   POTASSIUM CHLORIDE PO Take 1 tablet by mouth daily. Not sure the dose     Allergies:   Gluten meal, Wheat bran, Amiodarone, Mold extract [trichophyton], Neosporin [neomycin-bacitracin zn-polymyx], Other, Petroleum jelly lip treatment [dimethicone], and Tape   Social History   Socioeconomic History   Marital status: Single    Spouse name: Not on file   Number of children: 0  Years of education: Not on file   Highest education level: Not on file  Occupational History   Not on file  Tobacco Use   Smoking status: Former    Packs/day: 1.00    Years: 20.00    Pack years: 20.00    Types: Cigarettes    Quit date: 01/28/1990    Years since quitting: 31.4   Smokeless tobacco: Never  Vaping Use   Vaping Use: Never used  Substance and Sexual Activity   Alcohol use: Yes    Comment: rare   Drug use: No   Sexual activity: Not on file  Other Topics Concern   Not on file  Social History Narrative   Not on file   Social Determinants  of Health   Financial Resource Strain: Not on file  Food Insecurity: Not on file  Transportation Needs: Not on file  Physical Activity: Not on file  Stress: Not on file  Social Connections: Not on file     Family History: The patient's family history includes Hypertension in her mother.  ROS:   Please see the history of present illness.    All other systems reviewed and are negative.  EKGs/Labs/Other Studies Reviewed:    The following studies were reviewed today:  June 29, 2019 EKG shows sinus rhythm July 15, 2019 EKG shows sinus rhythm March 28, 2020 EKG shows atrial fibrillation October 24, 2020 EKG shows atrial fibrillation May 22, 2021 EKG shows atrial fibrillation  EKG:  The ekg ordered today demonstrates atrial fibrillation with a rapid ventricular rate of 138 bpm.   Recent Labs: 11/17/2020: BUN 16; Creatinine, Ser 0.79; Hemoglobin 15.3; Potassium 5.0; Sodium 139  Recent Lipid Panel    Component Value Date/Time   CHOL 107 01/30/2016 0229   TRIG 61 01/30/2016 0229   HDL 41 01/30/2016 0229   CHOLHDL 2.6 01/30/2016 0229   VLDL 12 01/30/2016 0229   LDLCALC 54 01/30/2016 0229    Physical Exam:    VS:  BP (!) 150/84   Pulse (!) 138   Ht 5\' 7"  (1.702 m)   Wt 264 lb 6.4 oz (119.9 kg)   SpO2 98%   BMI 41.41 kg/m     Wt Readings from Last 3 Encounters:  06/27/21 264 lb 6.4 oz (119.9 kg)  05/22/21 266 lb (120.7 kg)  04/24/21 268 lb (121.6 kg)     GEN:  Well nourished, well developed in no acute distress.  Obese HEENT: Normal NECK: No JVD; No carotid bruits LYMPHATICS: No lymphadenopathy CARDIAC: Irregularly irregular, tachycardic, no murmurs, rubs, gallops RESPIRATORY:  Clear to auscultation without rales, wheezing or rhonchi  ABDOMEN: Soft, non-tender, non-distended MUSCULOSKELETAL:  No edema; No deformity  SKIN: Warm and dry NEUROLOGIC:  Alert and oriented x 3 PSYCHIATRIC:  Normal affect       ASSESSMENT:    1. Persistent atrial fibrillation (HCC)     PLAN:    In order of problems listed above:  #Persistent atrial fibrillation The patient has a long history of atrial fibrillation and has been in atrial fibrillation for almost 2 years consistently.  She takes Eliquis for stroke prophylaxis.  Today she is poorly rate controlled with a heart rate of 138 bpm.  She did not tolerate amiodarone in the past.  I discussed treatment options for her atrial fibrillation including antiarrhythmic drugs.  I do not think she is a good candidate for catheter ablation at this time given the chronicity of her atrial fibrillation.  We would need to  be able to document sustained sinus rhythm on antiarrhythmic drugs prior to reconsidering atrial fibrillation ablation.  I discussed Tikosyn in depth with the patient including its risks and need for inpatient hospitalization.  She would like some time to think about her options before making a final decision which I think is very reasonable.  She will reach out to [place] should she decide to proceed with Tikosyn loading.  She should follow-up with her primary cardiologist for continued rate control efforts in the meantime.  She should increase her metoprolol to 150 mg by mouth once daily.  She should follow-up with Dr. Emelda Brothers office in the next 6 to 8 weeks to ensure improved rate control.  #Obesity #Hypertension Continue current medication regimen.   Medication Adjustments/Labs and Tests Ordered: Current medicines are reviewed at length with the patient today.  Concerns regarding medicines are outlined above.  Orders Placed This Encounter  Procedures   EKG 12-Lead   No orders of the defined types were placed in this encounter.    Signed, Rossie Muskrat. Lalla Brothers, MD, [facility], [facility] 06/27/2021 9:03 PM    Electrophysiology [facility]

## 2021-06-27 NOTE — Patient Instructions (Signed)
Medication Instructions:  Your physician recommends that you continue on your current medications as directed. Please refer to the Current Medication list given to you today. *If you need a refill on your cardiac medications before your next appointment, please call your pharmacy*  Lab Work: None. If you have labs (blood work) drawn today and your tests are completely normal, you will receive your results only by: [place] (if you have MyChart) OR A paper copy in the mail If you have any lab test that is abnormal or we need to change your treatment, we will call you to review the results.  Testing/Procedures: None.  Follow-Up: At [facility], you and your health needs are our priority.  As part of our continuing mission to provide you with exceptional heart care, we have created designated Provider Care Teams.  These Care Teams include your primary Cardiologist (physician) and Advanced Practice Providers (APPs -  Physician Assistants and Nurse Practitioners) who all work together to provide you with the care you need, when you need it.  Your physician wants you to follow-up in: Please call the office if you wish to move forward with Tikosyn.   We recommend signing up for the patient portal called "MyChart".  Sign up information is provided on this After Visit Summary.  MyChart is used to connect with patients for Virtual Visits (Telemedicine).  Patients are able to view lab/test results, encounter notes, upcoming appointments, etc.  Non-urgent messages can be sent to your provider as well.   To learn more about what you can do with MyChart, go to [email or web address].    Any Other Special Instructions Will Be Listed Below (If Applicable).  Tikosyn (Dofetilide) Hospital Admission   Prior to day of admission:  Check with drug insurance company for cost of drug to ensure affordability --- Dofetilide 500 mcg twice a day.  GoodRx is an option if insurance copay is unaffordable.    All patients are tested for COVID-19 prior to admission.   No Benadryl is allowed 3 days prior to admission.   Please ensure no missed doses of your anticoagulation (blood thinner) for 3 weeks prior to admission. If a dose is missed please notify our office immediately.   A pharmacist will review all your medications for potential interactions with Tikosyn. If any medication changes are needed prior to admission we will be in touch with you.   If any new medications are started AFTER your admission date is set with Nurse, adult. Please notify our office immediately so your medication list can be updated and reviewed by our pharmacist again.  On day of admission:  Tikosyn initiation requires a 3 night/4 day hospital stay with constant telemetry monitoring. You will have an EKG after each dose of Tikosyn as well as daily lab draws.   If the drug does not convert you to normal rhythm a cardioversion after the 4th dose of Tikosyn.   Afib Clinic office visit on the morning of admission is needed for preliminary labs/ekg.   Time of admission is dependent on bed availability in the hospital. In some instances, you will be sent home until bed is available. Rarely admission can be delayed to the following day if hospital census prevents available beds.   You may bring personal belongings/clothing with you to the hospital. Please leave your suitcase in the car until you arrive in admissions.   Questions please call our office at [phone]   Dofetilide Capsules What is this medication? DOFETILIDE (doe FET il  ide) treats a fast or irregular heartbeat (arrhythmia). It works by slowing down overactive electric signals in the heart, which stabilizes your heart rhythm. It belongs to a group of medications called antiarrhythmics. This medicine may be used for other purposes; ask your health care provider or pharmacist if you have questions. COMMON BRAND NAME(S): Tikosyn What should I tell my care team  before I take this medication? They need to know if you have any of these conditions: Heart disease History of irregular heartbeat History of low levels of potassium or magnesium in the blood Kidney disease Liver disease An unusual or allergic reaction to dofetilide, other medications, foods, dyes, or preservatives Pregnant or trying to get pregnant Breast-feeding How should I use this medication? Take this medication by mouth with a glass of water. Follow the directions on the prescription label. Do not take with grapefruit juice. You can take it with or without food. If it upsets your stomach, take it with food. Take your medication at regular intervals. Do not take it more often than directed. Do not stop taking except on your care team's advice. A special MedGuide will be given to you by the pharmacist with each prescription and refill. Be sure to read this information carefully each time. Talk to your care team about the use of this medication in children. Special care may be needed. Overdosage: If you think you have taken too much of this medicine contact a poison control center or emergency room at once. NOTE: This medicine is only for you. Do not share this medicine with others. What if I miss a dose? If you miss a dose, skip it. Take your next dose at the normal time. Do not take extra or 2 doses at the same time to make up for the missed dose. What may interact with this medication? Do not take this medication with any of the following: Cimetidine Cisapride Dolutegravir Dronedarone Erdafitinib Hydrochlorothiazide Ketoconazole Megestrol Pimozide Prochlorperazine Thioridazine Trimethoprim Verapamil This medication may also interact with the following: Amiloride Cannabinoids Certain antibiotics like erythromycin or clarithromycin Certain antiviral medications for HIV or hepatitis Certain medications for depression, anxiety, or psychotic  disorders Digoxin Diltiazem Grapefruit juice Metformin Nefazodone Other medications that prolong the QT interval (an abnormal heart rhythm) Quinine Triamterene Zafirlukast Ziprasidone This list may not describe all possible interactions. Give your health care provider a list of all the medicines, herbs, non-prescription drugs, or dietary supplements you use. Also tell them if you smoke, drink alcohol, or use illegal drugs. Some items may interact with your medicine. What should I watch for while using this medication? Your condition will be monitored carefully while you are receiving this medication. What side effects may I notice from receiving this medication? Side effects that you should report to your care team as soon as possible: Allergic reactions--skin rash, itching, hives, swelling of the face, lips, tongue, or throat Chest pain Heart rhythm changes--fast or irregular heartbeat, dizziness, feeling faint or lightheaded, chest pain, trouble breathing Side effects that usually do not require medical attention (report to your care team if they continue or are bothersome): Dizziness Headache Nausea Stomach pain Trouble sleeping This list may not describe all possible side effects. Call your doctor for medical advice about side effects. You may report side effects to FDA at 1-800-FDA-1088. Where should I keep my medication? Keep out of the reach of children. Store at room temperature between 15 and 30 degrees C (59 and 86 degrees F). Throw away any unused medication after  the expiration date. NOTE: This sheet is a summary. It may not cover all possible information. If you have questions about this medicine, talk to your doctor, pharmacist, or health care provider.  2023 Elsevier/Gold Standard (2020-12-08 00:00:00)

## 2021-06-28 ENCOUNTER — Telehealth: Payer: Self-pay | Admitting: *Deleted

## 2021-06-28 MED ORDER — METOPROLOL SUCCINATE ER 100 MG PO TB24
150.0000 mg | ORAL_TABLET | Freq: Every day | ORAL | 3 refills | Status: DC
Start: 2021-06-28 — End: 2022-09-24

## 2021-06-28 NOTE — Telephone Encounter (Signed)
-----   Message from Vickie Epley, MD sent at 06/27/2021  9:08 PM EDT ----- Tawni Pummel, can you have this patient increase her metoprolol to 150 mg by mouth once daily.  She should have a follow-up appointment with Dr. Brennan Bailey office in the next 6 to 8 weeks. Thanks, Lysbeth Galas

## 2021-07-23 ENCOUNTER — Telehealth: Payer: Self-pay | Admitting: Cardiology

## 2021-07-23 NOTE — Telephone Encounter (Signed)
LVM on 07/18/21 to move up appointment w/EKG per ST, as patient had medications adjusted with Dr. Lalla Brothers. Patient has not responded since. Please advise.

## 2021-09-14 ENCOUNTER — Other Ambulatory Visit: Payer: Self-pay | Admitting: Cardiology

## 2021-09-14 DIAGNOSIS — I10 Essential (primary) hypertension: Secondary | ICD-10-CM

## 2021-10-23 ENCOUNTER — Encounter: Payer: Self-pay | Admitting: Cardiology

## 2021-10-23 ENCOUNTER — Ambulatory Visit: Payer: PPO | Admitting: Cardiology

## 2021-10-23 VITALS — BP 154/94 | HR 103 | Temp 97.6°F | Resp 16 | Ht 67.0 in | Wt 261.0 lb

## 2021-10-23 DIAGNOSIS — Z7901 Long term (current) use of anticoagulants: Secondary | ICD-10-CM

## 2021-10-23 DIAGNOSIS — I10 Essential (primary) hypertension: Secondary | ICD-10-CM

## 2021-10-23 DIAGNOSIS — I4819 Other persistent atrial fibrillation: Secondary | ICD-10-CM

## 2021-10-23 DIAGNOSIS — Z87891 Personal history of nicotine dependence: Secondary | ICD-10-CM

## 2021-10-23 NOTE — Progress Notes (Signed)
ID:  Briana Kane, DOB [birth year], MRN [id number]  PCP:  Hayden Rasmussen, MD  Cardiologist:  Rex Kras, DO, [facility] (established care 07/15/2019) Former Cardiology Providers: Dr. Angelena Form (consult 01/2016) Electrophysiologist: Dr. Curt Bears  Date: 10/23/21 Last Office Visit: 05/22/2021  Chief Complaint  Patient presents with   Follow-up    Afib    HPI  Briana Kane is a 71 y.o. female whose past medical history and cardiovascular risk factors include: Persistent atrial fibrillation status post cardioversion x2, hypertension, history of congestive heart failure, former smoker, postmenopausal female, advanced age, obesity due to excess calories.  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  Underwent cardioversion x2 in the past and was on amiodarone for short period of time.  Amiodarone was discontinued due to medication profile and patient preference.  Since then patient has not been too inclined to be on antiarrhythmic medications.  Since last office visit she was evaluated by Dr. Quentin Ore who recommended possible Tikosyn loading the patient had refused and wanted to think about it.  Patient is not inclined to be on Tikosyn for now and wants to focus on rate control strategy.  Patient's ventricular rate is not well controlled.  However, states that at home her heart rates are usually around 80 bpm.  FUNCTIONAL STATUS: No structured exercise program or daily routine.    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   B Complex Vitamins (B COMPLEX PO) Take 1 tablet by mouth 2 (two) times daily.   COENZYME Q-10 PO Take 1 capsule by mouth 4 (four) times daily.   ELIQUIS 5 MG TABS tablet TAKE 1 TABLET(5 MG) BY MOUTH TWICE DAILY   HAWTHORNE BERRY PO Take 1 capsule by mouth 2 (two) times daily.   losartan (COZAAR) 25 MG tablet TAKE 1 TABLET(25 MG) BY MOUTH IN THE MORNING   MAGNESIUM PO Take 1,000 mg by mouth daily.   metoprolol succinate (TOPROL-XL) 100 MG 24 hr tablet Take 1.5 tablets (150 mg total) by mouth daily. Take with or immediately following a meal.   Misc Natural Products (TURMERIC CURCUMIN) CAPS Take 2 capsules by mouth 2 (two) times daily.   NON FORMULARY Take 2 capsules by mouth daily. Taurine 1,000 mg   OVER THE COUNTER MEDICATION Beet Root: Take 1 capsule by mouth four times a day   OVER THE COUNTER MEDICATION Ashwaganda capsules: Take 2 capsules by mouth two times a day   POTASSIUM CHLORIDE PO Take 1 tablet by mouth daily. Not sure the dose     PAST MEDICAL HISTORY: Past Medical History:  Diagnosis Date   Acute systolic CHF (congestive heart failure) ([employer]) 02/02/2016   Atrial fibrillation with RVR ([place]) 01/29/2016   Community acquired pneumonia 01/29/2016  Dyspnea    Hypertension    Morbid obesity with BMI of 45.0-49.9, adult ([place]) 02/02/2016    PAST SURGICAL HISTORY: Past Surgical History:  Procedure Laterality Date   CARDIOVERSION N/A 02/01/2016   Procedure: CARDIOVERSION;  Surgeon: Thayer Headings, MD;  Location: [place];  Service: Cardiovascular;  Laterality: N/A;   CARDIOVERSION N/A 02/26/2016   Procedure: CARDIOVERSION;  Surgeon: Jerline Pain, MD;  Location: [facility] ENDOSCOPY;  Service: Cardiovascular;  Laterality: N/A;   TEE WITHOUT CARDIOVERSION N/A 02/01/2016    Procedure: TRANSESOPHAGEAL ECHOCARDIOGRAM (TEE);  Surgeon: Thayer Headings, MD;  Location: [facility] ENDOSCOPY;  Service: Cardiovascular;  Laterality: N/A;    FAMILY HISTORY: The patient family history includes Hypertension in her mother.  SOCIAL HISTORY:  The patient  reports that she quit smoking about 31 years ago. Her smoking use included cigarettes. She has a 20.00 pack-year smoking history. She has never used smokeless tobacco. She reports current alcohol use. She reports that she does not use drugs.  REVIEW OF SYSTEMS: Review of Systems  Respiratory:  Negative for shortness of breath.   Cardiovascular:  Negative for chest pain, palpitations, orthopnea, claudication, leg swelling and PND.  Neurological:  Negative for dizziness and loss of consciousness.  Endo/Heme/Allergies:        No hematochezia, no hemoptysis, no hematemesis, no melanotic stools   PHYSICAL EXAM:    10/23/2021    2:42 PM 06/27/2021    3:40 PM 05/22/2021    3:08 PM  Vitals with BMI  Height 5\' 7"  5\' 7"  5\' 7"   Weight 261 lbs 264 lbs 6 oz 266 lbs  BMI 40.87 07.6 22.63  Systolic 335 456 256  Diastolic 94 84 93  Pulse 389 138 88   Physical Exam  Constitutional: No distress.  Age appropriate, hemodynamically stable.   Neck: No JVD present.  Cardiovascular: S1 normal, S2 normal, intact distal pulses and normal pulses. An irregularly irregular rhythm present. Tachycardia present. Exam reveals no gallop, no S3 and no S4.  No murmur heard. Pulmonary/Chest: Effort normal and breath sounds normal. No stridor. She has no wheezes. She has no rales.  Abdominal: Soft. Bowel sounds are normal. She exhibits no distension. There is no abdominal tenderness.  Musculoskeletal:        General: No edema.     Cervical back: Neck supple.  Neurological: She is alert and oriented to person, place, and time. She has intact cranial nerves (2-12).  Skin: Skin is warm and moist.   CARDIAC DATABASE: EKG: 07/15/2019: Sinus  Rhythm, LAFB, left  axis deviation, old anteroseptal infarct, poor R wave progression, without underlying ischemia or injury pattern.  05/22/2021: A-fib, 114 bpm, left axis, left anterior fascicular block, consider old anteroseptal infarct.  10/23/2021: Atrial fibrillation, 105 bpm, left axis, left anterior fascicular block, without underlying injury pattern.  Echocardiogram: 1/101/2018: LVEF severely reduced, 25-30%, diffuse hypokinesis, moderate MR, moderately dilated left atrium, mildly dilated RV with reduced systolic function, moderately dilated right atrium, moderate TR.  06/24/2016: LVEF 55%, normal wall motion, elevated LVEDP and LAP, mildly dilated left atrium.  07/23/2019:  Normal LV systolic function with EF 55%. Left ventricle cavity is normal in size. Normal left ventricular wall thickness. Normal global wall motion. Indeterminate diastolic filling pattern due to E:A fusion. Patient was tachycardic throughout the study.  Structurally normal mitral valve.  Mild (Grade I) mitral regurgitation.  Structurally normal tricuspid valve.  Mild tricuspid regurgitation. No evidence of pulmonary hypertension. RVSP measures 30 mmHg.  LABORATORY DATA:  Latest Ref Rng & Units 11/17/2020    1:25 PM 04/21/2020    1:12 PM 06/18/2019    5:30 PM  CBC  WBC 4.0 - 10.5 K/uL   7.9   Hemoglobin 11.1 - 15.9 g/dL 15.3  14.8  15.2   Hematocrit 34.0 - 46.6 % 45.4  45.2  46.1   Platelets 150 - 400 K/uL   258        Latest Ref Rng & Units 11/17/2020    1:25 PM 04/21/2020    1:15 PM 07/30/2019    1:57 PM  CMP  Glucose 70 - 99 mg/dL 109  93  100   BUN 8 - 27 mg/dL 16  20  18    Creatinine 0.57 - 1.00 mg/dL 0.79  0.80  0.82   Sodium 134 - 144 mmol/L 139  141  139   Potassium 3.5 - 5.2 mmol/L 5.0  4.8  4.6   Chloride 96 - 106 mmol/L 102  102  100   CO2 20 - 29 mmol/L 21  22  22    Calcium 8.7 - 10.3 mg/dL 9.4  9.5  9.6     Lipid Panel     Component Value Date/Time   CHOL 107 01/30/2016 0229   TRIG 61 01/30/2016 0229    HDL 41 01/30/2016 0229   CHOLHDL 2.6 01/30/2016 0229   VLDL 12 01/30/2016 0229   LDLCALC 54 01/30/2016 0229    No components found for: "NTPROBNP" No results for input(s): "PROBNP" in the last 8760 hours.  No results for input(s): "TSH" in the last 8760 hours.   BMP Recent Labs    11/17/20 1325  NA 139  K 5.0  CL 102  CO2 21  GLUCOSE 109*  BUN 16  CREATININE 0.79  CALCIUM 9.4    HEMOGLOBIN A1C Lab Results  Component Value Date   HGBA1C 6.0 (H) 01/30/2016   MPG 126 01/30/2016    IMPRESSION:    ICD-10-CM   1. Persistent atrial fibrillation (HCC)  I48.19 EKG 12-Lead    Hemoglobin and hematocrit, blood    Basic metabolic panel    2. Long term (current) use of anticoagulants  Z79.01     3. Benign hypertension  I10     4. Class 3 severe obesity due to excess calories with serious comorbidity and body mass index (BMI) of 40.0 to 44.9 in adult (HCC)  E66.01    Z68.41     5. Former smoker  Z87.891      RECOMMENDATIONS: Briana Kane is a 71 y.o. female whose past medical history and cardiac risk factors include: Atrial fibrillation status post cardioversion x2, hypertension, history of congestive heart failure, former smoker, postmenopausal female, advanced age, obesity due to excess calories.  Persistent atrial fibrillation (HCC) Rate control: Metoprolol. Rhythm control N/A. Thromboembolic prophylaxis Eliquis. History of cardioversion x2. Has been evaluated by electrophysiology since last office visit.   They recommended Tikosyn loading; however, patient refuses at this time. She wants to continue rate control strategy. Since her ventricular rate has not been well controlled in the office recommended increasing Toprol-XL to 200 mg p.o. daily.  The patient would like to hold off on this for now.  I have asked her to be more cognizant of pulse and if her heart rate/pulse is consistently greater than 110 bpm would recommend up titration of beta-blocker therapy.    Long term (current) use of anticoagulants Indication: Persistent atrial fibrillation. Does not endorse evidence of bleeding. Will check  hemoglobin hematocrit and BMP.  Benign hypertension Office blood pressures are not well controlled; however, patient states that home blood pressures are currently well controlled on medical therapy. Currently managed by primary care provider.   FINAL MEDICATION LIST END OF ENCOUNTER: No orders of the defined types were placed in this encounter.   Current Outpatient Medications:    Ascorbic Acid (VITAMIN C) 1000 MG tablet, Take 2,000 mg by mouth daily., Disp: , Rfl:    B Complex Vitamins (B COMPLEX PO), Take 1 tablet by mouth 2 (two) times daily., Disp: , Rfl:    COENZYME Q-10 PO, Take 1 capsule by mouth 4 (four) times daily., Disp: , Rfl:    ELIQUIS 5 MG TABS tablet, TAKE 1 TABLET(5 MG) BY MOUTH TWICE DAILY, Disp: 60 tablet, Rfl: 6   HAWTHORNE BERRY PO, Take 1 capsule by mouth 2 (two) times daily., Disp: , Rfl:    losartan (COZAAR) 25 MG tablet, TAKE 1 TABLET(25 MG) BY MOUTH IN THE MORNING, Disp: 90 tablet, Rfl: 1   MAGNESIUM PO, Take 1,000 mg by mouth daily., Disp: , Rfl:    metoprolol succinate (TOPROL-XL) 100 MG 24 hr tablet, Take 1.5 tablets (150 mg total) by mouth daily. Take with or immediately following a meal., Disp: 135 tablet, Rfl: 3   Misc Natural Products (TURMERIC CURCUMIN) CAPS, Take 2 capsules by mouth 2 (two) times daily., Disp: , Rfl:    NON FORMULARY, Take 2 capsules by mouth daily. Taurine 1,000 mg, Disp: , Rfl:    OVER THE COUNTER MEDICATION, Beet Root: Take 1 capsule by mouth four times a day, Disp: , Rfl:    OVER THE COUNTER MEDICATION, Ashwaganda capsules: Take 2 capsules by mouth two times a day, Disp: , Rfl:    POTASSIUM CHLORIDE PO, Take 1 tablet by mouth daily. Not sure the dose, Disp: , Rfl:   Orders Placed This Encounter  Procedures   Hemoglobin and hematocrit, blood   Basic metabolic panel   EKG [id number]    There  are no Patient Instructions on file for this visit.   --Continue cardiac medications as reconciled in final medication list. --Return in about 6 months (around 04/24/2022) for Follow up, A. fib. Or sooner if needed. --Continue follow-up with your primary care physician regarding the management of your other chronic comorbid conditions.  Patient's questions and concerns were addressed to her satisfaction. She voices understanding of the instructions provided during this encounter.   This note was created using a voice recognition software as a result there may be grammatical errors inadvertently enclosed that do not reflect the nature of this encounter. Every attempt is made to correct such errors.  Rex Kras, [place], [facility]  Pager: [phone] Office: [phone]

## 2021-11-28 LAB — BASIC METABOLIC PANEL
BUN/Creatinine Ratio: 24 (ref 12–28)
BUN: 21 mg/dL (ref 8–27)
CO2: 21 mmol/L (ref 20–29)
Calcium: 9.8 mg/dL (ref 8.7–10.3)
Chloride: 104 mmol/L (ref 96–106)
Creatinine, Ser: 0.87 mg/dL (ref 0.57–1.00)
Glucose: 103 mg/dL — ABNORMAL HIGH (ref 70–99)
Potassium: 5 mmol/L (ref 3.5–5.2)
Sodium: 141 mmol/L (ref 134–144)
eGFR: 72 mL/min/{1.73_m2} (ref >59)

## 2021-11-28 LAB — HEMOGLOBIN AND HEMATOCRIT, BLOOD
Hematocrit: 45.1 % (ref 34.0–46.6)
Hemoglobin: 15.2 g/dL (ref 11.1–15.9)

## 2021-12-06 NOTE — Progress Notes (Signed)
Called patient, NA, LMAM

## 2021-12-06 NOTE — Progress Notes (Signed)
Called patient no answer, left a vm

## 2022-02-28 ENCOUNTER — Encounter (HOSPITAL_COMMUNITY): Payer: Self-pay | Admitting: *Deleted

## 2022-04-25 ENCOUNTER — Ambulatory Visit: Payer: PPO | Admitting: Cardiology

## 2022-04-25 ENCOUNTER — Encounter: Payer: Self-pay | Admitting: Cardiology

## 2022-04-25 VITALS — BP 172/98 | HR 114 | Resp 18 | Ht 67.0 in | Wt 274.0 lb

## 2022-04-25 DIAGNOSIS — Z87891 Personal history of nicotine dependence: Secondary | ICD-10-CM

## 2022-04-25 DIAGNOSIS — Z7901 Long term (current) use of anticoagulants: Secondary | ICD-10-CM

## 2022-04-25 DIAGNOSIS — I10 Essential (primary) hypertension: Secondary | ICD-10-CM

## 2022-04-25 DIAGNOSIS — I4819 Other persistent atrial fibrillation: Secondary | ICD-10-CM

## 2022-04-25 MED ORDER — APIXABAN 5 MG PO TABS
5.0000 mg | ORAL_TABLET | Freq: Two times a day (BID) | ORAL | 0 refills | Status: DC
Start: 2022-04-25 — End: 2023-06-17

## 2022-04-25 NOTE — Progress Notes (Signed)
ID:  ASE WIITALA, DOB [birth year], MRN [id number]  PCP:  Hayden Rasmussen, MD  Cardiologist:  Rex Kras, DO, [facility] (established care 07/15/2019) Former Cardiology Providers: Dr. Angelena Form (consult 01/2016) Electrophysiologist: Dr. Curt Bears  Date: 04/25/22 Last Office Visit: 10/23/2021  Chief Complaint  Patient presents with   Atrial Fibrillation   Follow-up    6 months    HPI  Briana Kane is a 72 y.o. female whose past medical history and cardiovascular risk factors include: Persistent atrial fibrillation status post cardioversion x2, hypertension, history of congestive heart failure, former smoker, postmenopausal female, advanced age, obesity due to excess calories.  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  Underwent cardioversion x2 in the past and was on amiodarone for short period of time.  Amiodarone was discontinued due to medication profile and patient preference.  Since then patient has not been too inclined to be on antiarrhythmic medications.   In the past has seen electrophysiology to discuss antiarrhythmic options versus catheter directed ablation.  Shared decision was to consider inpatient hospitalization Tikosyn loading the patient refused.  Therefore the shared decision made to focus on rate control strategy.  Her Toprol-XL was increased to 150 mg.  At last office visit she was recommended to increase Toprol-XL to 200 mg p.o. daily; however, she remains reluctant.  Since last office visit patient states that she is doing well.  Denies anginal chest pain or heart failure symptoms.  Her heart rate at home when checked is around 90 bpm.  They usually elevated when she Sees providers according to the patient.  She does not endorse any evidence of bleeding.  FUNCTIONAL STATUS: No structured exercise program or daily routine.    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   B Complex Vitamins (B COMPLEX PO) Take 1 tablet by mouth 2 (two) times daily.   COENZYME Q-10 PO Take 1 capsule by mouth 4 (four) times daily.   HAWTHORNE BERRY PO Take 1 capsule by mouth 2 (two) times daily.   losartan (COZAAR) 25 MG tablet TAKE 1 TABLET(25 MG) BY MOUTH IN THE MORNING   MAGNESIUM PO Take 1,000 mg by mouth daily.   metoprolol succinate (TOPROL-XL) 100 MG 24 hr tablet Take 1.5 tablets (150 mg total) by mouth daily. Take with or immediately following a meal.   Misc Natural Products (TURMERIC CURCUMIN) CAPS Take 2 capsules by mouth 2 (two) times daily.   NON FORMULARY Take 2 capsules by mouth daily. Taurine 1,000 mg   OVER THE COUNTER MEDICATION Beet Root: Take 1 capsule by mouth four times a day   OVER THE COUNTER MEDICATION Ashwaganda capsules: Take 2 capsules by mouth two times a day   POTASSIUM CHLORIDE PO Take 1 tablet by  mouth daily. Not sure the dose   [DISCONTINUED] ELIQUIS 5 MG TABS tablet TAKE 1 TABLET(5 MG) BY MOUTH TWICE DAILY     PAST MEDICAL HISTORY: Past Medical History:  Diagnosis Date   Acute systolic CHF (congestive heart failure) 02/02/2016   Atrial fibrillation with RVR 01/29/2016   Community acquired pneumonia 01/29/2016   Dyspnea    Hypertension    Morbid obesity with BMI of 45.0-49.9, adult 02/02/2016    PAST SURGICAL HISTORY: Past Surgical History:   Procedure Laterality Date   CARDIOVERSION N/A 02/01/2016   Procedure: CARDIOVERSION;  Surgeon: Thayer Headings, MD;  Location: [facility];  Service: Cardiovascular;  Laterality: N/A;   CARDIOVERSION N/A 02/26/2016   Procedure: CARDIOVERSION;  Surgeon: Jerline Pain, MD;  Location[facility] ENDOSCOPY;  Service: Cardiovascular;  Laterality: N/A;   TEE WITHOUT CARDIOVERSION N/A 02/01/2016   Procedure: TRANSESOPHAGEAL ECHOCARDIOGRAM (TEE);  Surgeon: Thayer Headings, MD;  Location: [facility] ENDOSCOPY;  Service: Cardiovascular;  Laterality: N/A;    FAMILY HISTORY: The patient family history includes Hypertension in her mother.  SOCIAL HISTORY:  The patient  reports that she quit smoking about 32 years ago. Her smoking use included cigarettes. She has a 20.00 pack-year smoking history. She has never used smokeless tobacco. She reports current alcohol use. She reports that she does not use drugs.  REVIEW OF SYSTEMS: Review of Systems  Cardiovascular:  Negative for chest pain, claudication, dyspnea on exertion, irregular heartbeat, leg swelling, near-syncope, orthopnea, palpitations, paroxysmal nocturnal dyspnea and syncope.  Respiratory:  Negative for shortness of breath.   Hematologic/Lymphatic: Negative for bleeding problem.  Musculoskeletal:  Negative for muscle cramps and myalgias.  Neurological:  Negative for dizziness and light-headedness.   PHYSICAL EXAM:    04/25/2022    2:47 PM 10/23/2021    2:42 PM 06/27/2021    3:40 PM  Vitals with BMI  Height 5\' 7"  5\' 7"  5\' 7"   Weight 274 lbs 261 lbs 264 lbs 6 oz  BMI 42.9 [id number]  Systolic [id number] 123456 Q000111Q  Diastolic 98 94 84  Pulse [id number] 103 138   Physical Exam  Constitutional: No distress.  Age appropriate, hemodynamically stable.   Neck: No JVD present.  Cardiovascular: S1 normal, S2 normal, intact distal pulses and normal pulses. An irregularly irregular rhythm present. Tachycardia present. Exam reveals no gallop, no S3 and no S4.  No murmur  heard. Pulmonary/Chest: Effort normal and breath sounds normal. No stridor. She has no wheezes. She has no rales.  Abdominal: Soft. Bowel sounds are normal. She exhibits no distension. There is no abdominal tenderness.  Musculoskeletal:        General: No edema.     Cervical back: Neck supple.  Neurological: She is alert and oriented to person, place, and time. She has intact cranial nerves (2-12).  Skin: Skin is warm and moist.   CARDIAC DATABASE: EKG: 07/15/2019: Sinus  Rhythm, LAFB, left axis deviation, old anteroseptal infarct, poor R wave progression, without underlying ischemia or injury pattern.  April 25, 2022: Atrial fibrillation, 124 bpm, left axis, without underlying injury pattern.  Echocardiogram: 1/101/2018: LVEF severely reduced, 25-30%, diffuse hypokinesis, moderate MR, moderately dilated left atrium, mildly dilated RV with reduced systolic function, moderately dilated right atrium, moderate TR.  06/24/2016: LVEF 55%, normal wall motion, elevated LVEDP and LAP, mildly dilated left atrium.  07/23/2019:  Normal LV systolic function with EF 55%. Left ventricle cavity is normal in size. Normal left ventricular wall thickness. Normal global wall motion. Indeterminate diastolic filling  pattern due to E:A fusion. Patient was tachycardic throughout the study.  Structurally normal mitral valve.  Mild (Grade I) mitral regurgitation.  Structurally normal tricuspid valve.  Mild tricuspid regurgitation. No evidence of pulmonary hypertension. RVSP measures 30 mmHg.  LABORATORY DATA:    Latest Ref Rng & Units 11/27/2021   11:05 AM 11/17/2020    1:25 PM 04/21/2020    1:12 PM  CBC  Hemoglobin 11.1 - 15.9 g/dL 15.2  15.3  14.8   Hematocrit 34.0 - 46.6 % 45.1  45.4  45.2        Latest Ref Rng & Units 11/27/2021   11:05 AM 11/17/2020    1:25 PM 04/21/2020    1:15 PM  CMP  Glucose 70 - 99 mg/dL 103  109  93   BUN 8 - 27 mg/dL 21  16  20    Creatinine 0.57 - 1.00 mg/dL 0.87  0.79  0.80    Sodium 134 - 144 mmol/L 141  139  141   Potassium 3.5 - 5.2 mmol/L 5.0  5.0  4.8   Chloride 96 - 106 mmol/L 104  102  102   CO2 20 - 29 mmol/L 21  21  22    Calcium 8.7 - 10.3 mg/dL 9.8  9.4  9.5     Lipid Panel     Component Value Date/Time   CHOL 107 01/30/2016 0229   TRIG 61 01/30/2016 0229   HDL 41 01/30/2016 0229   CHOLHDL 2.6 01/30/2016 0229   VLDL 12 01/30/2016 0229   LDLCALC 54 01/30/2016 0229    No components found for: "NTPROBNP" No results for input(s): "PROBNP" in the last 8760 hours.  No results for input(s): "TSH" in the last 8760 hours.   BMP Recent Labs    11/27/21 1105  NA 141  K 5.0  CL 104  CO2 21  GLUCOSE 103*  BUN 21  CREATININE 0.87  CALCIUM 9.8    HEMOGLOBIN A1C Lab Results  Component Value Date   HGBA1C 6.0 (H) 01/30/2016   MPG 126 01/30/2016    IMPRESSION:    ICD-10-CM   1. Persistent atrial fibrillation  I48.19 EKG 12-Lead    Hemoglobin and hematocrit, blood    Basic metabolic panel    PCV ECHOCARDIOGRAM COMPLETE    2. Long term (current) use of anticoagulants  Z79.01 apixaban (ELIQUIS) 5 MG TABS tablet    3. Benign hypertension  I10     4. Class 3 severe obesity due to excess calories with serious comorbidity and body mass index (BMI) of 40.0 to 44.9 in adult  E66.01    Z68.41     5. Former smoker  Z87.891      RECOMMENDATIONS: Briana Kane is a 72 y.o. female whose past medical history and cardiac risk factors include: Atrial fibrillation status post cardioversion x2, hypertension, history of congestive heart failure, former smoker, postmenopausal female, advanced age, obesity due to excess calories.  Persistent atrial fibrillation (HCC) Rate control: Metoprolol. Rhythm control N/A. Thromboembolic prophylaxis Eliquis. History of cardioversion x2. EKG today illustrates atrial fibrillation with acceptable ventricular rates. She notes that the home heart rates around 90 bpm. She does not want to uptitrate  Toprol-XL to 200 mg p.o. daily. She has been evaluated by EP as well recommended Tikosyn loading; however, patient refuses.  Long term (current) use of anticoagulants Indication: Persistent atrial fibrillation. Does not endorse evidence of bleeding. Eliquis refilled. Recheck hemoglobin hematocrit and BMP  Benign hypertension Office blood pressures are  not well-controlled. Patient states home blood pressures are better controlled. She remains reluctant with uptitration of medical therapy. Reemphasized importance of low-salt diet.  Given her atrial fibrillation, uncontrolled hypertension, oral anatomy, discussed considering sleep consultation to evaluate for sleep apnea.  Patient does not want to as she think she does not have sleep apnea.  I also spoke to the patient that given her history of tachycardic induced cardiomyopathy and the fact that the ventricular rates based on office vital signs are not well-controlled.  Like to repeat an echocardiogram to reevaluate LVEF to make sure she has not developed tachycardic induced cardiomyopathy.  Patient states that she does not want to get any additional testing done.  I have ordered an echocardiogram and ECG is reviewed.   FINAL MEDICATION LIST END OF ENCOUNTER: Meds ordered this encounter  Medications   apixaban (ELIQUIS) 5 MG TABS tablet    Sig: Take 1 tablet (5 mg total) by mouth 2 (two) times daily.    Dispense:  180 tablet    Refill:  0    Current Outpatient Medications:    Ascorbic Acid (VITAMIN C) 1000 MG tablet, Take 2,000 mg by mouth daily., Disp: , Rfl:    B Complex Vitamins (B COMPLEX PO), Take 1 tablet by mouth 2 (two) times daily., Disp: , Rfl:    COENZYME Q-10 PO, Take 1 capsule by mouth 4 (four) times daily., Disp: , Rfl:    HAWTHORNE BERRY PO, Take 1 capsule by mouth 2 (two) times daily., Disp: , Rfl:    losartan (COZAAR) 25 MG tablet, TAKE 1 TABLET(25 MG) BY MOUTH IN THE MORNING, Disp: 90 tablet, Rfl: 1   MAGNESIUM PO,  Take 1,000 mg by mouth daily., Disp: , Rfl:    metoprolol succinate (TOPROL-XL) 100 MG 24 hr tablet, Take 1.5 tablets (150 mg total) by mouth daily. Take with or immediately following a meal., Disp: 135 tablet, Rfl: 3   Misc Natural Products (TURMERIC CURCUMIN) CAPS, Take 2 capsules by mouth 2 (two) times daily., Disp: , Rfl:    NON FORMULARY, Take 2 capsules by mouth daily. Taurine 1,000 mg, Disp: , Rfl:    OVER THE COUNTER MEDICATION, Beet Root: Take 1 capsule by mouth four times a day, Disp: , Rfl:    OVER THE COUNTER MEDICATION, Ashwaganda capsules: Take 2 capsules by mouth two times a day, Disp: , Rfl:    POTASSIUM CHLORIDE PO, Take 1 tablet by mouth daily. Not sure the dose, Disp: , Rfl:    apixaban (ELIQUIS) 5 MG TABS tablet, Take 1 tablet (5 mg total) by mouth 2 (two) times daily., Disp: 180 tablet, Rfl: 0  Orders Placed This Encounter  Procedures   Hemoglobin and hematocrit, blood   Basic metabolic panel   EKG [id number]   PCV ECHOCARDIOGRAM COMPLETE    There are no Patient Instructions on file for this visit.   --Continue cardiac medications as reconciled in final medication list. --Return in about 6 months (around 10/25/2022) for Follow up, A. fib. Or sooner if needed. --Continue follow-up with your primary care physician regarding the management of your other chronic comorbid conditions.  Patient's questions and concerns were addressed to her satisfaction. She voices understanding of the instructions provided during this encounter.   This note was created using a voice recognition software as a result there may be grammatical errors inadvertently enclosed that do not reflect the nature of this encounter. Every attempt is made to correct such errors.  Skyler Carel Zeigler, DO, [facility]  Pager:  [phone] Office: [phone]

## 2022-09-24 ENCOUNTER — Other Ambulatory Visit: Payer: Self-pay

## 2022-09-24 MED ORDER — METOPROLOL SUCCINATE ER 100 MG PO TB24: 150.0000 mg | ORAL_TABLET | Freq: Every day | ORAL | 0 refills | Status: DC

## 2022-10-30 ENCOUNTER — Other Ambulatory Visit: Payer: Self-pay

## 2022-10-30 MED ORDER — METOPROLOL SUCCINATE ER 100 MG PO TB24: 150.0000 mg | ORAL_TABLET | Freq: Every day | ORAL | 0 refills | Status: DC

## 2022-11-07 ENCOUNTER — Ambulatory Visit: Payer: Self-pay | Admitting: Cardiology

## 2023-02-12 ENCOUNTER — Encounter: Payer: Self-pay | Admitting: Cardiology

## 2023-02-12 ENCOUNTER — Ambulatory Visit: Payer: PPO | Attending: Cardiology | Admitting: Cardiology

## 2023-02-12 VITALS — BP 162/92 | HR 119 | Resp 12 | Ht 67.0 in | Wt 259.0 lb

## 2023-02-12 DIAGNOSIS — I4819 Other persistent atrial fibrillation: Secondary | ICD-10-CM

## 2023-02-12 DIAGNOSIS — Z6841 Body Mass Index (BMI) 40.0 and over, adult: Secondary | ICD-10-CM

## 2023-02-12 DIAGNOSIS — Z7901 Long term (current) use of anticoagulants: Secondary | ICD-10-CM | POA: Diagnosis not present

## 2023-02-12 DIAGNOSIS — I10 Essential (primary) hypertension: Secondary | ICD-10-CM | POA: Diagnosis not present

## 2023-02-12 DIAGNOSIS — E66813 Obesity, class 3: Secondary | ICD-10-CM | POA: Diagnosis not present

## 2023-02-12 DIAGNOSIS — Z87891 Personal history of nicotine dependence: Secondary | ICD-10-CM

## 2023-02-12 MED ORDER — METOPROLOL SUCCINATE ER 100 MG PO TB24: 150.0000 mg | ORAL_TABLET | Freq: Every day | ORAL | 1 refills | Status: DC

## 2023-02-12 MED ORDER — LOSARTAN POTASSIUM 25 MG PO TABS: 25.0000 mg | ORAL_TABLET | Freq: Every day | ORAL | 1 refills | Status: DC

## 2023-02-12 NOTE — Progress Notes (Signed)
Cardiology Office Note:  .   Date:  02/12/2023  ID:  Briana Kane, DOB [birth year], MRN [id number] PCP:  Dois Davenport, MD  Former Cardiology Providers: Dr. Waymond [facility] Providers Cardiologist:  Tessa Lerner, DO Electrophysiologist:  Lanier Prude, MD , [facility] (established care 06/24/201) Electrophysiologist:  Lanier Prude, MD  Click to update primary MD,subspecialty MD or APP then REFRESH:1}    Chief Complaint  Patient presents with   Persistent atrial fibrillation    Follow-up    6 months    History of Present Illness: Briana Kane is a 73 y.o. female whose past medical history and cardiovascular risk factors includes: Persistent atrial fibrillation status post cardioversion x2, hypertension, history of congestive heart failure, former smoker, postmenopausal female, advanced age, obesity due to excess calories.   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  Underwent cardioversion x2 in the past and was on amiodarone for short period of time.  Amiodarone was discontinued due to medication profile and patient preference.  Since then patient has not been too inclined to be on antiarrhythmic medications.    In the past has seen electrophysiology to discuss antiarrhythmic options versus catheter directed ablation.  Shared decision was to consider inpatient hospitalization Tikosyn loading the patient refused.  Therefore the shared decision made to focus on rate control strategy.  In the past patient was recommended to increase her Toprol-XL to 200 mg p.o. daily the patient was reluctant and wanted to stay on 150 mg p.o. daily.  However, patient has ran out of her blood pressure medications as well as Toprol-XL and requesting refills.  Since last office visit she denies any anginal chest pain or heart failure symptoms.  She prefers to continue with rate control strategy and is not interested in rhythm  management.  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  However, the echocardiogram is also pending.  Review of Systems: .   Review of Systems  Cardiovascular:  Negative for chest pain, claudication, irregular heartbeat, leg swelling, near-syncope, orthopnea, palpitations, paroxysmal nocturnal dyspnea and syncope.  Respiratory:  Negative for shortness of breath.   Hematologic/Lymphatic: Negative for bleeding problem.    Studies Reviewed:   EKG: EKG Interpretation Date/Time:  Wednesday February 12 2023 15:05:51 EST Ventricular Rate:  119 PR Interval:    QRS Duration:  86 QT Interval:  320 QTC Calculation: 450 R Axis:   -70  Text Interpretation: Atrial fibrillation with rapid ventricular response Left axis deviation Septal infarct (cited on or before 29-Jan-2016) When compared with ECG of April 25, 2022 No significant change since last tracing Confirmed by Tessa Lerner [phone]) on 02/12/2023 3:13:53 PM  Echocardiogram: 1/101/2018: LVEF severely reduced, 25-30%, diffuse hypokinesis, moderate MR, moderately dilated left atrium, mildly dilated RV with reduced systolic function, moderately dilated right atrium, moderate TR.   06/24/2016: LVEF 55% see report for additional details   07/23/2019: Normal LV systolic function with EF 55%. Left ventricle cavity is normal in size. Normal left ventricular wall thickness. Normal global wall motion. Indeterminate diastolic filling pattern due to E:A fusion. Patient was tachycardic throughout the study.  Structurally normal mitral valve.  Mild (Grade I) mitral regurgitation.  Structurally normal tricuspid valve.  Mild tricuspid regurgitation. No evidence of pulmonary hypertension. RVSP measures 30 mmHg.  RADIOLOGY: NA  Risk Assessment/Calculations:   Click  Here to Calculate/Change CHADS2VASc Score The patient's CHADS2-VASc  score is 4, indicating a 4.8% annual risk of stroke.     Labs:       Latest Ref Rng & Units 11/27/2021   11:05 AM 11/17/2020    1:25 PM 04/21/2020    1:12 PM  CBC  Hemoglobin 11.1 - 15.9 g/dL 16.1  09.6  04.5   Hematocrit 34.0 - 46.6 % 45.1  45.4  45.2        Latest Ref Rng & Units 11/27/2021   11:05 AM 11/17/2020    1:25 PM 04/21/2020    1:15 PM  BMP  Glucose 70 - 99 mg/dL 409  811  93   BUN 8 - 27 mg/dL 21  16  20    Creatinine 0.57 - 1.00 mg/dL 9.14  7.82  9.56   BUN/Creat Ratio 12 - 28 24  20  25    Sodium 134 - 144 mmol/L 141  139  141   Potassium 3.5 - 5.2 mmol/L 5.0  5.0  4.8   Chloride 96 - 106 mmol/L 104  102  102   CO2 20 - 29 mmol/L 21  21  22    Calcium 8.7 - 10.3 mg/dL 9.8  9.4  9.5       Latest Ref Rng & Units 11/27/2021   11:05 AM 11/17/2020    1:25 PM 04/21/2020    1:15 PM  CMP  Glucose 70 - 99 mg/dL 213  086  93   BUN 8 - 27 mg/dL 21  16  20    Creatinine 0.57 - 1.00 mg/dL 5.78  4.69  6.29   Sodium 134 - 144 mmol/L 141  139  141   Potassium 3.5 - 5.2 mmol/L 5.0  5.0  4.8   Chloride 96 - 106 mmol/L 104  102  102   CO2 20 - 29 mmol/L 21  21  22    Calcium 8.7 - 10.3 mg/dL 9.8  9.4  9.5     Lab Results  Component Value Date   CHOL 107 01/30/2016   HDL 41 01/30/2016   LDLCALC 54 01/30/2016   TRIG 61 01/30/2016   CHOLHDL 2.6 01/30/2016   No results for input(s): "LIPOA" in the last 8760 hours. No components found for: "NTPROBNP" No results for input(s): "PROBNP" in the last 8760 hours. No results for input(s): "TSH" in the last 8760 hours.   Physical Exam:    Today's Vitals   02/12/23 1505  BP: (!) 162/92  Pulse: (!) 119  Resp: 12  SpO2: 96%  Weight: 259 lb (117.5 kg)  Height: 5\' 7"  (1.702 m)   Body mass index is 40.57 kg/m. Wt Readings from Last 3 Encounters:  02/12/23 259 lb (117.5 kg)  04/25/22 274 lb (124.3 kg)  10/23/21 261 lb (118.4 kg)    Physical Exam  Constitutional: No distress.  Age appropriate, hemodynamically stable.   Neck:  No JVD present.  Cardiovascular: S1 normal, S2 normal, intact distal pulses and normal pulses. An irregularly irregular rhythm present. Tachycardia present. Exam reveals no gallop, no S3 and no S4.  No murmur heard. Pulses:      Dorsalis pedis pulses are 2+ on the right side and 2+ on the left side.       Posterior tibial pulses are 2+ on the right side and 2+ on the left side.  Pulmonary/Chest: Effort normal and breath sounds normal. No stridor. She has no wheezes. She has no rales.  Abdominal: Soft. Bowel  sounds are normal. She exhibits no distension. There is no abdominal tenderness.  Musculoskeletal:        General: No edema.     Cervical back: Neck supple.  Neurological: She is alert and oriented to person, place, and time. She has intact cranial nerves (2-12).  Skin: Skin is warm and moist.   Impression & Recommendation(s):  Impression:   ICD-10-CM   1. Persistent atrial fibrillation (HCC)  I48.19 EKG 12-Lead    metoprolol succinate (TOPROL-XL) 100 MG 24 hr tablet    Basic metabolic panel    ECHOCARDIOGRAM COMPLETE    Basic metabolic panel    Hemoglobin and hematocrit, blood    Hemoglobin and hematocrit, blood    Basic metabolic panel    2. Long term (current) use of anticoagulants  Z79.01 Hemoglobin and hematocrit, blood    Basic metabolic panel    Hemoglobin and hematocrit, blood    Hemoglobin and hematocrit, blood    Basic metabolic panel    3. Benign hypertension  I10 losartan (COZAAR) 25 MG tablet    Basic metabolic panel    Basic metabolic panel    Hemoglobin and hematocrit, blood    Hemoglobin and hematocrit, blood    Basic metabolic panel    4. Class 3 severe obesity due to excess calories with serious comorbidity and body mass index (BMI) of 40.0 to 44.9 in adult (HCC)  Z61.096    E66.01    Z68.41     5. Former smoker  Z87.891        Recommendation(s):  Persistent atrial fibrillation (HCC) Rate control: Metoprolol Rhythm control: N/A Thromboembolic  prophylaxis: Eliquis History of cardioversion x 2 EKG illustrates atrial fibrillation with rapid ventricular rate. Has been off of Toprol-XL 150 mg p.o. daily-likely the contributing factor.  Recommended increasing Toprol-XL to 200 mg p.o. daily the patient remains hesitant. She was on amiodarone in the past but discontinued secondary medication profile. Has been evaluated by EP who recommended rhythm management and consideration of Tikosyn loading-but patient refuses. Patient understands that being in persistent A-fib with ventricular rates that are not well-controlled predisposes her to reoccurrence of cardiomyopathy.  For the same reason recommend an echocardiogram at last office visit to reevaluate LVEF but the echo is still pending. Echo will be ordered to evaluate for structural heart disease and left ventricular systolic function.  Long term (current) use of anticoagulants Indication: Persistent atrial fibrillation. Does not endorse evidence of bleeding. Risks, benefits, alternatives to anticoagulation discussed. Check BMP and H&H today Check BMP and H&H in 6 months  Benign hypertension Office blood pressures are not well-controlled. Has been off of her losartan and is requesting a refill. I have asked her to log her blood pressures at home to see if additional medication is warranted and to follow-up with PCP. Reemphasized importance of low-salt diet.  Orders Placed:  Orders Placed This Encounter  Procedures   Basic metabolic panel   Hemoglobin and hematocrit, blood   Basic metabolic panel    Standing Status:   Future    Number of Occurrences:   1    Expected Date:   07/11/2023    Expiration Date:   02/12/2024   Hemoglobin and hematocrit, blood    Standing Status:   Future    Number of Occurrences:   1    Expected Date:   07/11/2023    Expiration Date:   02/12/2024   EKG 12-Lead   ECHOCARDIOGRAM COMPLETE    Standing Status:  Future    Expiration Date:   02/12/2024     Where should this test be performed:   [facility])    Does the patient weigh less than or greater than 250 lbs?:   Patient weighs greater than 250 lbs    Perflutren DEFINITY (image enhancing agent) should be administered unless hypersensitivity or allergy exist:   Administer Perflutren    Reason for exam-Echo:   Other-Full Diagnosis List    Full ICD-10/Reason for Exam:   Atrial fibrillation (HCC) [427.31.ICD-9-CM]    Final Medication List:    Meds ordered this encounter  Medications   metoprolol succinate (TOPROL-XL) 100 MG 24 hr tablet    Sig: Take 1.5 tablets (150 mg total) by mouth daily. Take with or immediately following a meal.    Dispense:  135 tablet    Refill:  1   losartan (COZAAR) 25 MG tablet    Sig: Take 1 tablet (25 mg total) by mouth daily.    Dispense:  90 tablet    Refill:  1    Medications Discontinued During This Encounter  Medication Reason   HAWTHORNE BERRY PO Patient Preference   Misc Natural Products (TURMERIC CURCUMIN) CAPS Patient Preference   NON FORMULARY Patient Preference   OVER THE COUNTER MEDICATION Patient Preference   OVER THE COUNTER MEDICATION Patient Preference   losartan (COZAAR) 25 MG tablet Reorder   metoprolol succinate (TOPROL-XL) 100 MG 24 hr tablet Reorder     Current Outpatient Medications:    apixaban (ELIQUIS) 5 MG TABS tablet, Take 1 tablet (5 mg total) by mouth 2 (two) times daily., Disp: 180 tablet, Rfl: 0   Ascorbic Acid (VITAMIN C) 1000 MG tablet, Take 2,000 mg by mouth daily., Disp: , Rfl:    B Complex Vitamins (B COMPLEX PO), Take 1 tablet by mouth 2 (two) times daily., Disp: , Rfl:    COENZYME Q-10 PO, Take 1 capsule by mouth 4 (four) times daily., Disp: , Rfl:    MAGNESIUM PO, Take 1,000 mg by mouth daily., Disp: , Rfl:    POTASSIUM CHLORIDE PO, Take 1 tablet by mouth daily. Not sure the dose, Disp: , Rfl:    losartan (COZAAR) 25 MG tablet, Take 1 tablet (25 mg total) by mouth daily., Disp: 90 tablet,  Rfl: 1   metoprolol succinate (TOPROL-XL) 100 MG 24 hr tablet, Take 1.5 tablets (150 mg total) by mouth daily. Take with or immediately following a meal., Disp: 135 tablet, Rfl: 1  Consent:   NA  Disposition:    6 months follow-up sooner if needed  Her questions and concerns were addressed to her satisfaction. She voices understanding of the recommendations provided during this encounter.    Signed, Tessa Lerner, DO, [facility]  [facility]  [address] [place], [place] [address] 02/12/2023 5:50 PM

## 2023-02-12 NOTE — Patient Instructions (Addendum)
Medication Instructions:  Your physician has recommended you make the following change in your medication:   Refill on Metoprolol XL 150MG  One tablet daily  Refill on Losartan 25 mg one tablet daily    *If you need a refill on your cardiac medications before your next appointment, please call your pharmacy*  Lab Work: BMP H&H  BMP and H&H in 5 months before next appointment   If you have labs (blood work) drawn today and your tests are completely normal, you will receive your results only by: MyChart Message (if you have MyChart) OR A paper copy in the mail If you have any lab test that is abnormal or we need to change your treatment, we will call you to review the results.  Testing/Procedures: Echo  Your physician has requested that you have an echocardiogram. Echocardiography is a painless test that uses sound waves to create images of your heart. It provides your doctor with information about the size and shape of your heart and how well your heart's chambers and valves are working. This procedure takes approximately one hour. There are no restrictions for this procedure. Please do NOT wear cologne, perfume, aftershave, or lotions (deodorant is allowed). Please arrive 15 minutes prior to your appointment time.  Please note: We ask at that you not bring children with you during ultrasound (echo/ vascular) testing. Due to room size and safety concerns, children are not allowed in the ultrasound rooms during exams. Our front office staff cannot provide observation of children in our lobby area while testing is being conducted. An adult accompanying a patient to their appointment will only be allowed in the ultrasound room at the discretion of the ultrasound technician under special circumstances. We apologize for any inconvenience.   Follow-Up: At [facility], you and your health needs are our priority.  As part of our continuing mission to provide you with exceptional heart care, we  have created designated Provider Care Teams.  These Care Teams include your primary Cardiologist (physician) and Advanced Practice Providers (APPs -  Physician Assistants and Nurse Practitioners) who all work together to provide you with the care you need, when you need it.  We recommend signing up for the patient portal called "MyChart".  Sign up information is provided on this After Visit Summary.  MyChart is used to connect with patients for Virtual Visits (Telemedicine).  Patients are able to view lab/test results, encounter notes, upcoming appointments, etc.  Non-urgent messages can be sent to your provider as well.   To learn more about what you can do with MyChart, go to [email or web address].    Your next appointment:   6 month(s)  The format for your next appointment:   In Person  Provider:   Tessa Lerner, DO {

## 2023-02-12 NOTE — Progress Notes (Signed)
vitals

## 2023-03-26 ENCOUNTER — Telehealth (HOSPITAL_COMMUNITY): Payer: Self-pay | Admitting: Cardiology

## 2023-03-26 NOTE — Telephone Encounter (Signed)
 Patient cancelled and echocardiogram scheduled for  04/02/23 and does not wish to reschedule. Order will be removed from the active echo WQ. Thank you

## 2023-04-02 ENCOUNTER — Ambulatory Visit (HOSPITAL_COMMUNITY): Payer: PPO

## 2023-06-17 ENCOUNTER — Other Ambulatory Visit: Payer: Self-pay

## 2023-06-17 DIAGNOSIS — Z7901 Long term (current) use of anticoagulants: Secondary | ICD-10-CM

## 2023-06-17 MED ORDER — APIXABAN 5 MG PO TABS: 5.0000 mg | ORAL_TABLET | Freq: Two times a day (BID) | ORAL | 0 refills | Status: DC

## 2023-06-17 NOTE — Telephone Encounter (Signed)
 Prescription refill request for Eliquis  received. Indication:afib Last office visit:1/25 ZOX:[id number] labs Age: 73 Weight:117.5  kg  Prescription refilled

## 2023-07-08 ENCOUNTER — Telehealth: Payer: Self-pay | Admitting: Cardiology

## 2023-07-08 DIAGNOSIS — Z7901 Long term (current) use of anticoagulants: Secondary | ICD-10-CM

## 2023-07-08 NOTE — Telephone Encounter (Signed)
*  STAT* If patient is at the pharmacy, call can be transferred to refill team.   1. Which medications need to be refilled? (please list name of each medication and dose if known) apixaban  (ELIQUIS ) 5 MG TABS tablet   2. Which pharmacy/location (including street and city if local pharmacy) is medication to be sent to?  [id number] - [place], [place] - [address] AT [facility] [address] [phone]    3. Do they need a 30 day or 90 day supply? 90

## 2023-07-08 NOTE — Telephone Encounter (Signed)
 Prescription refill request for Eliquis  received. Indication: Afib  Last office visit:02/12/23 ([place])  Scr: overdue  Age: 73 Weight: 117.5kg  Labs overdue. Called pt, no answer. Left message on voicemail.

## 2023-07-09 NOTE — Telephone Encounter (Signed)
 Called pt, no answer. Left message on voicemail.

## 2023-07-10 MED ORDER — APIXABAN 5 MG PO TABS: 5.0000 mg | ORAL_TABLET | Freq: Two times a day (BID) | ORAL | 0 refills | Status: DC

## 2023-07-10 NOTE — Telephone Encounter (Signed)
 Called pt to make her aware of overdue labs, no answer. Left message on voicemail with call back number.

## 2023-08-19 LAB — BASIC METABOLIC PANEL WITH GFR
BUN/Creatinine Ratio: 21 (ref 12–28)
BUN: 15 mg/dL (ref 8–27)
CO2: 20 mmol/L (ref 20–29)
Calcium: 9.4 mg/dL (ref 8.7–10.3)
Chloride: 104 mmol/L (ref 96–106)
Creatinine, Ser: 0.73 mg/dL (ref 0.57–1.00)
Glucose: 107 mg/dL — AB (ref 70–99)
Potassium: 4.5 mmol/L (ref 3.5–5.2)
Sodium: 139 mmol/L (ref 134–144)
eGFR: 88 mL/min/1.73 (ref >59)

## 2023-08-19 LAB — HEMOGLOBIN AND HEMATOCRIT, BLOOD
Hematocrit: 44.2 % (ref 34.0–46.6)
Hemoglobin: 14.5 g/dL (ref 11.1–15.9)

## 2023-08-20 ENCOUNTER — Ambulatory Visit: Payer: Self-pay | Admitting: Cardiology

## 2023-08-20 DIAGNOSIS — I4819 Other persistent atrial fibrillation: Secondary | ICD-10-CM

## 2023-08-20 DIAGNOSIS — Z7901 Long term (current) use of anticoagulants: Secondary | ICD-10-CM

## 2023-09-10 ENCOUNTER — Other Ambulatory Visit: Payer: Self-pay | Admitting: Cardiology

## 2023-09-10 DIAGNOSIS — Z7901 Long term (current) use of anticoagulants: Secondary | ICD-10-CM

## 2023-09-10 NOTE — Telephone Encounter (Signed)
 Prescription refill request for Eliquis  received. Indication:afib Last office visit:1/25 Scr:0.73  7/25 Age: 73 Weight:117.5  kg  Prescription refilled

## 2023-09-15 ENCOUNTER — Other Ambulatory Visit: Payer: Self-pay | Admitting: Cardiology

## 2023-09-15 DIAGNOSIS — I4819 Other persistent atrial fibrillation: Secondary | ICD-10-CM

## 2023-09-15 DIAGNOSIS — I10 Essential (primary) hypertension: Secondary | ICD-10-CM

## 2023-09-15 MED ORDER — LOSARTAN POTASSIUM 25 MG PO TABS: 25.0000 mg | ORAL_TABLET | Freq: Every day | ORAL | 1 refills | Status: AC

## 2023-09-15 MED ORDER — METOPROLOL SUCCINATE ER 100 MG PO TB24: 150.0000 mg | ORAL_TABLET | Freq: Every day | ORAL | 1 refills | Status: AC

## 2023-10-28 ENCOUNTER — Encounter: Payer: Self-pay | Admitting: Cardiology

## 2023-10-28 ENCOUNTER — Ambulatory Visit: Attending: Cardiology | Admitting: Cardiology

## 2023-10-28 VITALS — BP 137/88 | HR 111 | Resp 16 | Ht 67.0 in | Wt 257.2 lb

## 2023-10-28 DIAGNOSIS — I4819 Other persistent atrial fibrillation: Secondary | ICD-10-CM | POA: Diagnosis not present

## 2023-10-28 NOTE — Progress Notes (Signed)
 Cardiology Office Note:  .   Date:  10/28/2023  ID:  Briana Kane, DOB 1950/09/18, MRN [id number] PCP:  Burney Briana CROME, MD  Former Cardiology Providers: Dr. Ver[facility] Providers Cardiologist:  Madonna Large, DO, [facility] (established care 06/24/201)  Electrophysiologist:  OLE ONEIDA HOLTS, MD  Electrophysiologist:  OLE ONEIDA HOLTS, MD  Click to update primary MD,subspecialty MD or APP then REFRESH:1}    Chief Complaint  Patient presents with   Atrial Fibrillation   Follow-up    History of Present Illness: Briana Kane is a 73 y.o. female whose past medical history and cardiovascular risk factors includes: Persistent atrial fibrillation status post cardioversion x2, hypertension, history of congestive heart failure, former smoker, postmenopausal female, advanced age, obesity due to excess calories.   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  Underwent cardioversion x2 in the past and was on amiodarone  for short period of time.  Amiodarone  was discontinued due to medication profile and patient preference.  Since then patient has not been too inclined to be on antiarrhythmic medications.    In the past has seen electrophysiology to discuss antiarrhythmic options versus catheter directed ablation.  Shared decision was to consider inpatient hospitalization Tikosyn loading but patient refused.  Therefore the shared decision made to focus on rate control strategy.  In the past patient was recommended to increase her Toprol -XL to 200 mg p.o. daily the patient was reluctant and wanted to stay on 150 mg p.o. daily.    Since last office visit patient denies any anginal chest pain or heart failure symptoms. Prefers to continue with rate control strategy, does not want to be hospitalized for Tikosyn loading. Patient was recommended to have an echocardiogram to reevaluate LVEF to make sure she has not developed tachycardia  induced cardiomyopathy; however, the echo remains pending since April 2024.  When asked if there are any obstacles on reviewing the echo patient states  I cannot handle it.  Patient states I am not trying to remove it to be 90. Patient understands that elevated heart rates for prolonged period of time can lead to cardiomyopathy/heart failure.  In addition, patient voices her personal struggles with being in our facility as the office building is larger after his recent moved heart vascular center, patient states this place is too big, entirely too big.  I am still freaking out, I want a smaller facility.   Review of Systems: .   Review of Systems  Cardiovascular:  Negative for chest pain, claudication, irregular heartbeat, leg swelling, near-syncope, orthopnea, palpitations, paroxysmal nocturnal dyspnea and syncope.  Respiratory:  Negative for shortness of breath.   Hematologic/Lymphatic: Negative for bleeding problem.    Studies Reviewed:   EKG: EKG Interpretation Date/Time:  Tuesday October 28 2023 14:48:25 EDT Ventricular Rate:  111 PR Interval:    QRS Duration:  84 QT Interval:  324 QTC Calculation: 440 R Axis:   -72  Text Interpretation: Atrial fibrillation Left axis deviation Septal infarct (cited on or before 29-Jan-2016) When compared with ECG of 12-Feb-2023 15:05, No significant change was found Confirmed by Large Madonna [phone]) on 10/28/2023 3:23:58 PM  Echocardiogram: 1/101/2018: LVEF severely reduced, 25-30%, diffuse hypokinesis, moderate MR, moderately dilated left atrium, mildly dilated RV with reduced systolic function, moderately dilated right atrium, moderate TR.   06/24/2016: LVEF 55% see report for additional details   07/23/2019: Normal LV systolic function with EF 55%. Left ventricle cavity is normal in size.  Normal left ventricular wall thickness. Normal global wall motion. Indeterminate diastolic filling pattern due to E:A fusion. Patient was tachycardic  throughout the study.  Structurally normal mitral valve.  Mild (Grade I) mitral regurgitation.  Structurally normal tricuspid valve.  Mild tricuspid regurgitation. No evidence of pulmonary hypertension. RVSP measures 30 mmHg.  RADIOLOGY: NA  Risk Assessment/Calculations:   Click Here to Calculate/Change CHADS2VASc Score The patient's CHADS2-VASc score is 4, indicating a 4.8% annual risk of stroke.     Labs:       Latest Ref Rng & Units 08/18/2023    1:54 PM 11/27/2021   11:05 AM 11/17/2020    1:25 PM  CBC  Hemoglobin 11.1 - 15.9 g/dL 85.4  84.7  84.6   Hematocrit 34.0 - 46.6 % 44.2  45.1  45.4        Latest Ref Rng & Units 08/18/2023    1:54 PM 11/27/2021   11:05 AM 11/17/2020    1:25 PM  BMP  Glucose 70 - 99 mg/dL 892  896  890   BUN 8 - 27 mg/dL 15  21  16    Creatinine 0.57 - 1.00 mg/dL 9.26  9.12  9.20   BUN/Creat Ratio 12 - 28 21  24  20    Sodium 134 - 144 mmol/L 139  141  139   Potassium 3.5 - 5.2 mmol/L 4.5  5.0  5.0   Chloride 96 - 106 mmol/L 104  104  102   CO2 20 - 29 mmol/L 20  21  21    Calcium 8.7 - 10.3 mg/dL 9.4  9.8  9.4       Latest Ref Rng & Units 08/18/2023    1:54 PM 11/27/2021   11:05 AM 11/17/2020    1:25 PM  CMP  Glucose 70 - 99 mg/dL 892  896  890   BUN 8 - 27 mg/dL 15  21  16    Creatinine 0.57 - 1.00 mg/dL 9.26  9.12  9.20   Sodium 134 - 144 mmol/L 139  141  139   Potassium 3.5 - 5.2 mmol/L 4.5  5.0  5.0   Chloride 96 - 106 mmol/L 104  104  102   CO2 20 - 29 mmol/L 20  21  21    Calcium 8.7 - 10.3 mg/dL 9.4  9.8  9.4     Lab Results  Component Value Date   CHOL 107 01/30/2016   HDL 41 01/30/2016   LDLCALC 54 01/30/2016   TRIG 61 01/30/2016   CHOLHDL 2.6 01/30/2016   No results for input(s): LIPOA in the last 8760 hours. No components found for: NTPROBNP No results for input(s): PROBNP in the last 8760 hours. No results for input(s): TSH in the last 8760 hours.   Physical Exam:    Today's Vitals   10/28/23 1446  BP:  137/88  Pulse: (!) 111  Resp: 16  SpO2: 96%  Weight: 257 lb 3.2 oz (116.7 kg)  Height: 5' 7 (1.702 m)   Body mass index is 40.28 kg/m. Wt Readings from Last 3 Encounters:  10/28/23 257 lb 3.2 oz (116.7 kg)  02/12/23 259 lb (117.5 kg)  04/25/22 274 lb (124.3 kg)    Physical Exam  Constitutional: No distress.  Age appropriate, hemodynamically stable.   Neck: No JVD present.  Cardiovascular: S1 normal, S2 normal, intact distal pulses and normal pulses. An irregularly irregular rhythm present. Tachycardia present.  Pulses:      Dorsalis pedis pulses are 2+ on  the right side and 2+ on the left side.       Posterior tibial pulses are 2+ on the right side and 2+ on the left side.  No murmurs rubs or gallops appreciated secondary to tachycardia  Pulmonary/Chest: Effort normal and breath sounds normal. No stridor. She has no wheezes. She has no rales.  Abdominal: Soft. Bowel sounds are normal. She exhibits no distension. There is no abdominal tenderness.  Musculoskeletal:        General: No edema.     Cervical back: Neck supple.  Neurological: She is alert and oriented to person, place, and time. She has intact cranial nerves (2-12).  Skin: Skin is warm and moist.   Impression & Recommendation(s):  Impression:   ICD-10-CM   1. Persistent atrial fibrillation (HCC)  I48.19 EKG 12-Lead    Hemoglobin and hematocrit, blood    Basic metabolic panel with GFR    Basic metabolic panel with GFR    Hemoglobin and hematocrit, blood       Recommendation(s):  Persistent atrial fibrillation (HCC) Rate control: Metoprolol  Rhythm control: N/A Thromboembolic prophylaxis: Eliquis  History of cardioversion x 2 EKG illustrates atrial fibrillation with controlled ventricular rates. Remains hesitant to increase Toprol -XL to 200 mg p.o. daily Was on amiodarone  in the past but discontinued secondary to medication profile, per patient's request Has been evaluated by EP who recommended rhythm  management and to consider Tikosyn.  Patient does not want to be hospitalized for Tikosyn loading. Patient favors rate control strategy knowing that prolonged tachycardia can lead to cardiomyopathy/heart failure. Given her prolonged tachycardia recommended an echocardiogram to reevaluate LVEF, still remains pended. Reiterated the importance of repeat echocardiogram given her comorbidities.  And the role of GDMT if her LVEF is reduced.  Long term (current) use of anticoagulants Indication: Persistent atrial fibrillation. Does not endorse evidence of bleeding. Risks, benefits, alternatives to anticoagulation discussed. Labs from 08/18/2023 reviewed Will repeat labs in 6 months prior to her appointment with APP  Benign hypertension Office blood pressures are acceptable. Continue losartan  25 mg p.o. daily Cardiology following peripherally, managed by primary care provider.  Unfortunately, patient is overwhelmed coming into a bigger facility after we have moved to heart and vascular Center.  Patient prefers to be at a smaller office setting.  She is more than welcome to transfer her care to a different practice.  Will be more than happy to share records of such need arises.  Otherwise we will arrange a follow-up appointment to see one of our APP's in 6 months to reevaluate her renal function and hemoglobin and make sure she has no contraindications to anticoagulation.  Reemphasize importance of following through with the echocardiogram.  And I will see her back in 1 year sooner if needed  Orders Placed:  Orders Placed This Encounter  Procedures   Hemoglobin and hematocrit, blood    Standing Status:   Future    Number of Occurrences:   1    Expected Date:   04/21/2024    Expiration Date:   07/20/2024   Basic metabolic panel with GFR    Standing Status:   Future    Number of Occurrences:   1    Expected Date:   04/21/2024    Expiration Date:   07/20/2024   EKG 12-Lead    Final Medication List:     No orders of the defined types were placed in this encounter.   There are no discontinued medications.    Current Outpatient Medications:  apixaban  (ELIQUIS ) 5 MG TABS tablet, TAKE 1 TABLET(5 MG) BY MOUTH TWICE DAILY, Disp: 60 tablet, Rfl: 5   Ascorbic Acid (VITAMIN C) 1000 MG tablet, Take 2,000 mg by mouth daily., Disp: , Rfl:    B Complex Vitamins (B COMPLEX PO), Take 1 tablet by mouth 2 (two) times daily., Disp: , Rfl:    COENZYME Q-10 PO, Take 1 capsule by mouth 4 (four) times daily., Disp: , Rfl:    losartan  (COZAAR ) 25 MG tablet, Take 1 tablet (25 mg total) by mouth daily., Disp: 90 tablet, Rfl: 1   MAGNESIUM PO, Take 1,000 mg by mouth daily., Disp: , Rfl:    metoprolol  succinate (TOPROL -XL) 100 MG 24 hr tablet, Take 1.5 tablets (150 mg total) by mouth daily. Take with or immediately following a meal., Disp: 135 tablet, Rfl: 1   POTASSIUM CHLORIDE PO, Take 1 tablet by mouth daily. Not sure the dose, Disp: , Rfl:   Consent:   NA  Disposition:    6 months with APP. 1 year with myself, sooner if needed  Her questions and concerns were addressed to her satisfaction. She voices understanding of the recommendations provided during this encounter.    Signed, Madonna Michele HAS, [facility]  A Division of [facility] [address], [place], [place] [address]   10/28/2023 5:35 PM

## 2023-10-28 NOTE — Patient Instructions (Signed)
 Medication Instructions:  Your physician recommends that you continue on your current medications as directed. Please refer to the Current Medication list given to you today.  *If you need a refill on your cardiac medications before your next appointment, please call your pharmacy*  Lab Work: In 6 months: hematocrit and hemoglobin, and BMP (1 week prior to office visit)  You may go to any of these [facility] locations: [facility] - [address] ([facility] ) - [address] [phone] [address] (1st floor, next to pharmacy)   If you have labs (blood work) drawn today and your tests are completely normal, you will receive your results only by: [employer] (if you have MyChart) OR A paper copy in the mail If you have any lab test that is abnormal or we need to change your treatment, we will call you to review the results.  Follow-Up: At [facility], you and your health needs are our priority.  As part of our continuing mission to provide you with exceptional heart care, our providers are all part of one team.  This team includes your primary Cardiologist (physician) and Advanced Practice Providers or APPs (Physician Assistants and Nurse Practitioners) who all work together to provide you with the care you need, when you need it.  Your next appointment:   6 month(s)  Provider:   Hao Meng, PA-C or Glendia Ferrier, PA-C      Then, Sunit Tolia, DO will plan to see you again in 1 year(s).  We recommend signing up for the patient portal called MyChart.  Sign up information is provided on this After Visit Summary.  MyChart is used to connect with patients for Virtual Visits (Telemedicine).  Patients are able to view lab/test results, encounter notes, upcoming appointments, etc.  Non-urgent messages can be sent to your provider as well.    To learn more about what you can do with MyChart, go to  [email or web address].

## 2024-04-28 ENCOUNTER — Ambulatory Visit: Admitting: Cardiology
